# Patient Record
Sex: Female | Born: 1960 | Race: Black or African American | Hispanic: No | Marital: Married | Smoking: Never smoker
Health system: Southern US, Community
[De-identification: ages and names within clinical notes are randomized; demographics above are authoritative.]

## PROBLEM LIST (undated history)

## (undated) DIAGNOSIS — K219 Gastro-esophageal reflux disease without esophagitis: Secondary | ICD-10-CM

## (undated) DIAGNOSIS — Z87898 Personal history of other specified conditions: Secondary | ICD-10-CM

## (undated) DIAGNOSIS — E0590 Thyrotoxicosis, unspecified without thyrotoxic crisis or storm: Secondary | ICD-10-CM

## (undated) DIAGNOSIS — R42 Dizziness and giddiness: Secondary | ICD-10-CM

## (undated) DIAGNOSIS — B019 Varicella without complication: Secondary | ICD-10-CM

## (undated) DIAGNOSIS — T7840XA Allergy, unspecified, initial encounter: Secondary | ICD-10-CM

## (undated) HISTORY — DX: Varicella without complication: B01.9

## (undated) HISTORY — DX: Gastro-esophageal reflux disease without esophagitis: K21.9

## (undated) HISTORY — DX: Personal history of other specified conditions: Z87.898

## (undated) HISTORY — DX: Allergy, unspecified, initial encounter: T78.40XA

## (undated) HISTORY — DX: Dizziness and giddiness: R42

## (undated) HISTORY — PX: LIPOSUCTION MULTIPLE BODY PARTS: SUR832

---

## 1999-02-03 HISTORY — PX: COLPOSCOPY: SHX161

## 2007-04-03 LAB — CONVERTED CEMR LAB: Pap Smear: NORMAL

## 2007-05-24 ENCOUNTER — Encounter: Payer: Self-pay | Admitting: Internal Medicine

## 2008-12-04 ENCOUNTER — Ambulatory Visit: Payer: Self-pay | Admitting: Internal Medicine

## 2008-12-04 DIAGNOSIS — K219 Gastro-esophageal reflux disease without esophagitis: Secondary | ICD-10-CM | POA: Insufficient documentation

## 2008-12-04 DIAGNOSIS — J309 Allergic rhinitis, unspecified: Secondary | ICD-10-CM | POA: Insufficient documentation

## 2008-12-04 DIAGNOSIS — Z8669 Personal history of other diseases of the nervous system and sense organs: Secondary | ICD-10-CM

## 2009-04-12 ENCOUNTER — Ambulatory Visit: Payer: Self-pay | Admitting: Internal Medicine

## 2009-04-12 LAB — CONVERTED CEMR LAB
Bilirubin, Direct: 0.1 mg/dL (ref 0.0–0.3)
Cholesterol: 197 mg/dL (ref 0–200)
Eosinophils Absolute: 0.2 10*3/uL (ref 0.0–0.7)
GFR calc non Af Amer: 86.32 mL/min (ref >60)
Glucose, Bld: 95 mg/dL (ref 70–99)
HDL: 49.1 mg/dL (ref >39.00)
Hemoglobin, Urine: NEGATIVE
Lymphs Abs: 2.1 10*3/uL (ref 0.7–4.0)
MCHC: 31.7 g/dL (ref 30.0–36.0)
MCV: 82.5 fL (ref 78.0–100.0)
Monocytes Absolute: 0.4 10*3/uL (ref 0.1–1.0)
Neutrophils Relative %: 51.3 % (ref 43.0–77.0)
Nitrite: NEGATIVE
Platelets: 272 10*3/uL (ref 150.0–400.0)
Potassium: 4.5 meq/L (ref 3.5–5.1)
RDW: 14.5 % (ref 11.5–14.6)
Sodium: 140 meq/L (ref 135–145)
TSH: 0.8 microintl units/mL (ref 0.35–5.50)
Total Bilirubin: 0.6 mg/dL (ref 0.3–1.2)
Total Protein, Urine: NEGATIVE mg/dL
Triglycerides: 69 mg/dL (ref 0.0–149.0)
VLDL: 13.8 mg/dL (ref 0.0–40.0)
pH: 5.5 (ref 5.0–8.0)

## 2009-04-24 ENCOUNTER — Ambulatory Visit: Payer: Self-pay | Admitting: Internal Medicine

## 2009-04-24 ENCOUNTER — Other Ambulatory Visit: Admission: RE | Admit: 2009-04-24 | Discharge: 2009-04-24 | Payer: Self-pay | Admitting: Internal Medicine

## 2009-04-24 DIAGNOSIS — M25569 Pain in unspecified knee: Secondary | ICD-10-CM | POA: Insufficient documentation

## 2009-04-24 DIAGNOSIS — N959 Unspecified menopausal and perimenopausal disorder: Secondary | ICD-10-CM | POA: Insufficient documentation

## 2009-04-24 LAB — HM PAP SMEAR

## 2009-04-30 LAB — CONVERTED CEMR LAB: Pap Smear: NEGATIVE

## 2009-10-14 ENCOUNTER — Telehealth: Payer: Self-pay | Admitting: Internal Medicine

## 2009-10-17 ENCOUNTER — Ambulatory Visit: Payer: Self-pay | Admitting: Internal Medicine

## 2009-10-17 DIAGNOSIS — E669 Obesity, unspecified: Secondary | ICD-10-CM | POA: Insufficient documentation

## 2009-10-17 DIAGNOSIS — N946 Dysmenorrhea, unspecified: Secondary | ICD-10-CM

## 2009-10-17 DIAGNOSIS — IMO0002 Reserved for concepts with insufficient information to code with codable children: Secondary | ICD-10-CM

## 2009-11-13 ENCOUNTER — Telehealth: Payer: Self-pay | Admitting: *Deleted

## 2009-11-19 ENCOUNTER — Ambulatory Visit: Payer: Self-pay | Admitting: Internal Medicine

## 2009-11-19 LAB — CONVERTED CEMR LAB: Beta hcg, urine, semiquantitative: NEGATIVE

## 2009-11-22 ENCOUNTER — Telehealth: Payer: Self-pay | Admitting: *Deleted

## 2010-03-04 NOTE — Progress Notes (Signed)
Summary: need lab results   Phone Note Call from Patient Call back at Home Phone [phone]   Caller: Patient---triage vm Summary of Call: need labs results. please fax copy to her surgeon in [facility]. wants shannon to return her call. Initial call taken by: Warnell Forester,  November 22, 2009 9:58 AM  Follow-up for Phone Call        Spoke to pt- The fax number is [facility]  [phone] Follow-up by: Romualdo Bolk, CMA Duncan Dull),  November 22, 2009 11:52 AM  Additional Follow-up for Phone Call Additional follow up Details #1::        Results faxed to md. Additional Follow-up by: Romualdo Bolk, CMA Duncan Dull),  November 22, 2009 11:55 AM

## 2010-03-04 NOTE — Progress Notes (Signed)
Summary: need more info on upcoming appt.  Phone Note Outgoing Call Call back at [facility] Phone [phone]   Call placed by: Romualdo Bolk, CMA Duncan Dull),  October 14, 2009 5:27 PM Call placed to: Patient Summary of Call: Need more info on upcoming appt. Left message for pt to call back. Does she have a form that needs to be completed? Initial call taken by: Romualdo Bolk, CMA Duncan Dull),  October 14, 2009 5:28 PM  Follow-up for Phone Call        LMTOCB Follow-up by: Romualdo Bolk, CMA Duncan Dull),  October 16, 2009 8:59 AM  Additional Follow-up for Phone Call Additional follow up Details #1::        Spoke to pt and she just needs a EKG for lipo. No forms needed to fill out for surgery. Additional Follow-up by: Romualdo Bolk, CMA (AAMA),  October 17, 2009 8:03 AM

## 2010-03-04 NOTE — Progress Notes (Signed)
Summary: Pt req to get some lab work done for elective surgery  Phone Note Call from Patient Call back at [facility] Phone [phone]   Caller: Patient Summary of Call: Pt called and has been scheduled for elective Lipo surgery and is req to get some blood work, H&H and urine pregnacy. Pls advise.  Initial call taken by: Lucy Antigua,  November 13, 2009 9:49 AM  Follow-up for Phone Call        Pt has a rx order for labs and pt is to bring it with her. Follow-up by: Romualdo Bolk, CMA Duncan Dull),  November 13, 2009 11:56 AM  Additional Follow-up for Phone Call Additional follow up Details #1::        ok to do this  Additional Follow-up by: Madelin Headings MD,  November 13, 2009 5:50 PM    Additional Follow-up for Phone Call Additional follow up Details #2::    Left message for pt to call back to schedule the labs. Romualdo Bolk, CMA (AAMA)  November 15, 2009 11:42 AM appt made. Follow-up by: Romualdo Bolk, CMA (AAMA),  November 15, 2009 11:57 AM

## 2010-03-04 NOTE — Assessment & Plan Note (Signed)
Summary: EKG FOR MED CLEARANCE (FOR ELECTIVE SURGERY - LIPO) // RS   Vital Signs:  Patient profile:   50 year old female Menstrual status:  irregular LMP:     10/04/2009 Weight:      182 pounds Pulse rate:   72 / minute BP sitting:   120 / 80  (left arm) Cuff size:   regular  Vitals Entered By: Romualdo Bolk, CMA (AAMA) (October 17, 2009 8:33 AM) CC: Pt needs a EKG for lipo surgery on 10/24. LMP (date): 10/04/2009 LMP - Character: normal Menarche (age onset years): 14   Menses interval (days): 28-32 Menstrual flow (days): 4-5 Enter LMP: 10/04/2009 Last PAP Result NEGATIVE FOR INTRAEPITHELIAL LESIONS OR MALIGNANCY.   History of Present Illness: Deborah Mcdowell comes in today  for  above . 1.She is to have  liposuction for lateral back chest  area   on OCtober  24  Dr Edrick Oh.    and  poss ible buttock augmentation. .    She does reg exercise but no weights.    NO CP sob bleeding clotting in hx . and no fam hx of same  .     2.Has dysmenorrhea and usu takes nsaid but stopped pre op .   no asa.  asks for advice   3. 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   no numbness some pain on elevation and rotations .   pain  up  near trapezius.  no fall and no weakness  Preventive Screening-Counseling & Management  Alcohol-Tobacco     Alcohol drinks/day: 0     Smoking Status: never  Caffeine-Diet-Exercise     Caffeine use/day: less than 1     Does Patient Exercise: yes     Type of exercise: cardio  Current Medications (verified): 1)  Nexium 20 Mg Cpdr (Esomeprazole Magnesium) .Marland Kitchen.. 1 By Mouth Once Daily 2)  Fluticasone Propionate 50 Mcg/act Susp (Fluticasone Propionate) .... 2 Sprays Each Nares Q D  Allergies (verified): No Known Drug Allergies  Past History:  Past medical, surgical, family and social histories (including risk factors) reviewed for relevance to current acute and chronic problems.  Past Medical History: Reviewed history from  12/04/2008 and no changes required. Allergic rhinitis GERD Varicella  as a child  Vertigo intermittent positional   neg ct scan  G1P1 remote hx of atypical pap  ASCUS  neg HPV neg colp 2001  Normal otherwise   Past Surgical History: Reviewed history from 12/04/2008 and no changes required. Denies surgical history  Past History:  Care Management: None Current Surgery: Bonney Roussel, MD  Family History: Reviewed history from 12/04/2008 and no changes required. Father: Deceased-old age  98 Mother: HBP  84   Siblings: Sister-stroke, HBP  late 34  ,   Moyamoya ,  ; Sister passed of leukemia  23 Daughter : Crohns   Social History: Reviewed history from 12/04/2008 and no changes required. 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    Physical Exam  General:  Well-developed,well-nourished,in no acute distress; alert,appropriate and cooperative throughout examination Head:  normocephalic and  atraumatic.   Eyes:  vision grossly intact, pupils equal, and pupils round.   Ears:  R ear normal and L ear normal.   Neck:  No deformities, masses, or tenderness noted. Lungs:  Normal respiratory effort, chest expands symmetrically. Lungs are clear to auscultation, no crackles or wheezes. Heart:  Normal rate and regular rhythm. S1 and S2 normal without gallop, murmur, click, rub or other extra sounds.no lifts.   Abdomen:  Bowel sounds positive,abdomen soft and non-tender without masses, organomegaly or   noted. Msk:  right shoulder   nl rom  but tender  internal rotation.   No atrophy.   nl strength    Pulses:  pulses intact without delay  .   Extremities:  no clubbing cyanosis or edema . Neurologic:  alert & oriented X3 and gait normal.   nl strength  grossly non focal  Skin:  turgor normal, color normal, no ecchymoses, no petechiae, and no purpura.   Cervical Nodes:  No lymphadenopathy noted Psych:  Oriented X3, normally interactive, good eye contact, not anxious appearing, and not depressed appearing.   EKG NSR  Impression & Recommendations:  Problem # 1:  PREOPERATIVE EXAMINATION (ICD-V72.84)  for liposuction back  and poss buttock augmentation. No CI to surgery .  Orders: EKG w/ Interpretation (93000)  Problem # 2:  SHOULDER STRAIN, RIGHT (ICD-840.9) mild  shoulder strain  from lifting pat at work  . good rom today  and strength.   Ho with  exercises  given  and sic ice after work  if persistent or  progressive  call and consider see  ortho.   Problem # 3:  DYSMENORRHEA (ICD-625.3) disc  to take nsaid high dose   800 IBU or 2 aleve .. as long as none 2-3 days pre operatively .  Problem # 4:  GERD (ICD-530.81) only taking as needed  certain food s  aggravate.  Her updated medication list for this problem includes:    Nexium 20 Mg Cpdr (Esomeprazole magnesium) .Marland Kitchen... 1 by mouth once daily  Problem # 5:  OBESITY (ICD-278.00)  Ht: 62.5 (04/24/2009)   Wt: 182 (10/17/2009)   BMI: 31.97 (04/24/2009)  Complete Medication List: 1)  Nexium 20 Mg Cpdr (Esomeprazole magnesium) .Marland Kitchen.. 1 by mouth once daily 2)  Fluticasone Propionate 50 Mcg/act Susp (Fluticasone propionate) .... 2 sprays each nares q d  Patient Instructions: 1)  No contraindicatoins to surgery and EKG is normal 2)  Notify  us if more information is needed. 3)  shoulder   exercises  as discussed.

## 2010-03-04 NOTE — Assessment & Plan Note (Signed)
Summary: CPX//CCM/pt rescd//ccm   Vital Signs:  Patient profile:   50 year old female Menstrual status:  irregular LMP:     02/24/2009 Height:      62.5 inches Weight:      177 pounds BMI:     31.97 Pulse rate:   72 / minute BP sitting:   120 / 80  (right arm) Cuff size:   regular  Vitals Entered By: Romualdo Bolk, CMA Duncan Dull) (April 24, 2009 9:49 AM)  Nutrition Counseling: Patient's BMI is greater than 25 and therefore counseled on weight management options. CC: CPX with pap LMP (date): 02/24/2009 LMP - Character: normal Menarche (age onset years): 14   Menses interval (days): 28-32 Menstrual flow (days): 4-5 Menstrual Status irregular Enter LMP: 02/24/2009 Last PAP Result normal   History of Present Illness: Deborah Mcdowell comes in today for   for preventive visit   ? perimenopause hot  flushes off and on at night . Allergies ocass headaches  GERD  better on 20mg  and takes as needed.  BVaginal dryness beginning to use  otcs lubricants. interested in Liposuction  ? hard to lose weight  Preventive Care Screening  Prior Values:    Pap Smear:  normal (04/03/2007)    Mammogram:  normal (06/03/2007)    Last Tetanus Booster:  Historical (02/03/2007)   Preventive Screening-Counseling & Management  Alcohol-Tobacco     Alcohol drinks/day: 0     Smoking Status: never  Caffeine-Diet-Exercise     Caffeine use/day: less than 1     Does Patient Exercise: yes     Type of exercise: cardio  Hep-HIV-STD-Contraception     Dental Visit-last 6 months no     Sun Exposure-Excessive: no  Safety-Violence-Falls     Seat Belt Use: yes     Firearms in the Home: no firearms in the home     Smoke Detectors: yes      Blood Transfusions:  no.        Travel History:  no risk.    Current Medications (verified): 1)  Nexium 40 Mg Cpdr (Esomeprazole Magnesium) .Marland Kitchen.. 1 By Mouth Once Daily 2)  Fluticasone Propionate 50 Mcg/act Susp (Fluticasone Propionate) .... 2 Sprays Each Nares Q  D  Allergies (verified): No Known Drug Allergies  Past History:  Past medical, surgical, family and social histories (including risk factors) reviewed, and no changes noted (except as noted below).  Past Medical History: Reviewed history from 12/04/2008 and no changes required. Allergic rhinitis GERD Varicella  as a child  Vertigo intermittent positional   neg ct scan  G1P1 remote hx of atypical pap  ASCUS  neg HPV neg colp 2001  Normal otherwise   Past Surgical History: Reviewed history from 12/04/2008 and no changes required. Denies surgical history  Past History:  Care Management: None Current  Family History: Reviewed history from 12/04/2008 and no changes required. Father: Deceased-old age  74 Mother: HBP  48   Siblings: Sister-stroke, HBP  late 18  ,   Moyamoya ,  ; Sister passed of leukemia  24 Daughter  Crohns   Social History: Reviewed history from 12/04/2008 and no changes required. 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 Never Smoked Alcohol use-no Drug use-no Regular exercise-yes hhof 3  No pets  Mved back from [place] 2009 Seat Belt Use:  yes Dental Care w/in 6 mos.:  no Sun Exposure-Excessive:  no Blood Transfusions:  no  Review of Systems  The patient denies anorexia, fever, weight loss,  vision loss, decreased hearing, hoarseness, chest pain, syncope, dyspnea on exertion, peripheral edema, prolonged cough, hemoptysis, abdominal pain, melena, hematochezia, severe indigestion/heartburn, hematuria, muscle weakness, transient blindness, difficulty walking, depression, unusual weight change, abnormal bleeding, enlarged lymph nodes, angioedema, and breast masses.         right knee  pain bothers her when getting up  not at work no injury no am problem or limp.  Physical Exam General Appearance: well developed, well nourished, no acute distress Eyes: conjunctiva and lids normal, PERRLA, EOMI,  WNL Ears, Nose, Mouth, Throat: TM clear, nares  clear, oral exam WNL Neck: supple, no lymphadenopathy, no thyromegaly, no JVD Respiratory: clear to auscultation and percussion, respiratory effort normal Cardiovascular: regular rate and rhythm, S1-S2, no murmur, rub or gallop, no bruits, peripheral pulses normal and symmetric, no cyanosis, clubbing, edema or varicosities Chest: no scars, masses, tenderness; no asymmetry, skin changes, nipple discharge   Gastrointestinal: soft, non-tender; no hepatosplenomegaly, masses; active bowel sounds all quadrants, guaiac negative stool; no masses, tenderness, hemorrhoids  Genitourinary: no vaginal discharge, lesions; no masses or tenderness  PAP done  uterus  slighlty enlarged  vs bladder  Lymphatic: no cervical, axillary or inguinal adenopathy Musculoskeletal: gait normal, muscle tone and strength WNL, no joint swelling, effusions, discoloration, crepitus  Skin: clear, good turgor, color WNL, no rashes, lesions, or ulcerations Neurologic: normal mental status, normal reflexes, normal strength, sensation, and motion Psychiatric: alert; oriented to person, place and time Other Exam:  see labs normal     Impression & Recommendations:  Problem # 1:  HEALTH MAINTENANCE EXAM (ICD-V70.0) Discussed nutrition,exercise,diet,healthy weight, vitamin D and calcium.   Stool cards given  Problem # 2:  ROUTINE GYNECOLOGICAL EXAM (ICD-V72.31)  pap done   Orders: Pap Smear, Thin Prep ( Collection of) (Z6109)  Problem # 3:  PERIMENOPAUSAL SYNDROME (ICD-627.9) flushes and vag dryness  counseled   Problem # 4:  GERD (ICD-530.81)  Her updated medication list for this problem includes:    Nexium 20 Mg Cpdr (Esomeprazole magnesium) .Marland Kitchen... 1 by mouth once daily  Problem # 5:  aKNEE PAIN (ICD-719.46) no alarm symptom intermittent  disc  exercises and to see if persistent and progressive   further evaluation  Complete Medication List: 1)  Nexium 20 Mg Cpdr (Esomeprazole magnesium) .Marland Kitchen.. 1 by mouth once daily 2)   Fluticasone Propionate 50 Mcg/act Susp (Fluticasone propionate) .... 2 sprays each nares q d  Patient Instructions: 1)  will let your know about PAP 2)  It is important that you exercise reguarly at least 20 minutes 5 times a week. If you develop chest pain, have severe difficulty breathing, or feel very tired, stop exercising immediately and seek medical attention.  3)  Call if topical lubricants not helping. 4)  Your BMI is  31  . 5)   to lose weight. Consider a lower calorie diet and regular exercise.  6)  Get a mammogram  7)  CPX pap in a year  and check as needed.

## 2010-05-07 ENCOUNTER — Other Ambulatory Visit (INDEPENDENT_AMBULATORY_CARE_PROVIDER_SITE_OTHER): Admitting: Internal Medicine

## 2010-05-07 DIAGNOSIS — Z0000 Encounter for general adult medical examination without abnormal findings: Secondary | ICD-10-CM

## 2010-05-07 LAB — CBC WITH DIFFERENTIAL/PLATELET
Basophils Absolute: 0 10*3/uL (ref 0.0–0.1)
Eosinophils Absolute: 0.2 10*3/uL (ref 0.0–0.7)
HCT: 41.2 % (ref 36.0–46.0)
Hemoglobin: 13.3 g/dL (ref 12.0–15.0)
Lymphs Abs: 2.1 10*3/uL (ref 0.7–4.0)
MCHC: 32.3 g/dL (ref 30.0–36.0)
Monocytes Absolute: 0.5 10*3/uL (ref 0.1–1.0)
Monocytes Relative: 8.4 % (ref 3.0–12.0)
Neutro Abs: 3.5 10*3/uL (ref 1.4–7.7)
Platelets: 290 10*3/uL (ref 150.0–400.0)
RDW: 17.2 % — ABNORMAL HIGH (ref 11.5–14.6)

## 2010-05-07 LAB — LIPID PANEL
HDL: 42.7 mg/dL (ref >39.00)
Total CHOL/HDL Ratio: 5
Triglycerides: 46 mg/dL (ref 0.0–149.0)
VLDL: 9.2 mg/dL (ref 0.0–40.0)

## 2010-05-07 LAB — HEPATIC FUNCTION PANEL
ALT: 20 U/L (ref 0–35)
AST: 30 U/L (ref 0–37)
Alkaline Phosphatase: 62 U/L (ref 39–117)
Bilirubin, Direct: 0.1 mg/dL (ref 0.0–0.3)
Total Bilirubin: 0.7 mg/dL (ref 0.3–1.2)
Total Protein: 7.7 g/dL (ref 6.0–8.3)

## 2010-05-07 LAB — POCT URINALYSIS DIPSTICK
Bilirubin, UA: NEGATIVE
Blood, UA: NEGATIVE
Ketones, UA: NEGATIVE
Nitrite, UA: NEGATIVE
Protein, UA: NEGATIVE
pH, UA: 5.5

## 2010-05-07 LAB — BASIC METABOLIC PANEL
BUN: 14 mg/dL (ref 6–23)
CO2: 28 mEq/L (ref 19–32)
GFR: 88.18 mL/min (ref >60.00)
Glucose, Bld: 77 mg/dL (ref 70–99)
Potassium: 4.7 mEq/L (ref 3.5–5.1)
Sodium: 141 mEq/L (ref 135–145)

## 2010-05-08 ENCOUNTER — Encounter: Payer: Self-pay | Admitting: Internal Medicine

## 2010-05-14 ENCOUNTER — Encounter: Payer: Self-pay | Admitting: Internal Medicine

## 2010-05-14 ENCOUNTER — Ambulatory Visit (INDEPENDENT_AMBULATORY_CARE_PROVIDER_SITE_OTHER): Admitting: Internal Medicine

## 2010-05-14 VITALS — BP 128/80 | HR 72 | Ht 62.5 in | Wt 180.0 lb

## 2010-05-14 DIAGNOSIS — J309 Allergic rhinitis, unspecified: Secondary | ICD-10-CM

## 2010-05-14 DIAGNOSIS — K219 Gastro-esophageal reflux disease without esophagitis: Secondary | ICD-10-CM

## 2010-05-14 DIAGNOSIS — E785 Hyperlipidemia, unspecified: Secondary | ICD-10-CM

## 2010-05-14 DIAGNOSIS — M79671 Pain in right foot: Secondary | ICD-10-CM

## 2010-05-14 DIAGNOSIS — N959 Unspecified menopausal and perimenopausal disorder: Secondary | ICD-10-CM

## 2010-05-14 DIAGNOSIS — Z0000 Encounter for general adult medical examination without abnormal findings: Secondary | ICD-10-CM

## 2010-05-14 DIAGNOSIS — M79609 Pain in unspecified limb: Secondary | ICD-10-CM

## 2010-05-14 MED ORDER — FLUTICASONE PROPIONATE 50 MCG/ACT NA SUSP: 2.0000 | Freq: Every day | NASAL | Status: DC

## 2010-05-14 MED ORDER — ESOMEPRAZOLE MAGNESIUM 20 MG PO CPDR: 20.0000 mg | DELAYED_RELEASE_CAPSULE | Freq: Every day | ORAL | Status: DC

## 2010-05-14 NOTE — Progress Notes (Signed)
Subjective:    Patient ID: Deborah Mcdowell, female    DOB: March 09, 1960, 50 y.o.   MRN: [id number]  HPI Patient comes in for a check up today No major change in health status since last visit .  Had liposuction .  Has some swelling left abd nontender GERD:  Using nexium as needed  Stable better with dietary changes. Allergies  : on INCS  Needs refills  Right foot hurts for a day or so medial pain no injury but does 12 hours shifts on feet. No swelling. Good shoes   Review of Systems Neg cv pulm gi GU irreg menses last one march has some hot flushes.  Rest [place] or neg  Or as HPI Past Medical History  Diagnosis Date  . Allergy   . GERD (gastroesophageal reflux disease)   . Varicella     as a child  . Vertigo, intermittent     positional neg ct scan  . History of abnormal Pap smear     atypical ASCUS neg HPV neg    Past Surgical History  Procedure Date  . Colposcopy 2001  . Liposuction multiple body parts fall 2011    reports that she has never smoked. She does not have any smokeless tobacco history on file. She reports that she does not drink alcohol or use illicit drugs. family history includes Crohn's disease in her daughter; Hypertension in her mother and sister; Leukemia in her sister; Other in her sister; and Stroke in her sister. No Known Allergies     Objective:   Physical Exam Physical Exam: Vital signs reviewed YQM:VHQI is a well-developed well-nourished alert cooperative  AA  female who appears her stated age in no acute distress. Healthy appearing HEENT: normocephalic  traumatic , Eyes: PERRL EOM's full, conjunctiva clear, Nares: paten,t no deformity discharge or tenderness., Ears: no deformity EAC's clear TMs with normal landmarks. Mouth: clear OP, no lesions, edema.  Moist mucous membranes. Dentition in adequate repair. NECK: supple without masses, thyromegaly or bruits. CHEST/PULM:  Clear to auscultation and percussion breath sounds equal no wheeze , rales or rhonchi. No  chest wall deformities or tenderness. Breast: normal by inspection . No dimpling, discharge, masses, tenderness or discharge . CV: PMI is nondisplaced, S1 S2 no gallops, murmurs, rubs. Peripheral pulses are full without delay.No JVD .  ABDOMEN: Bowel sounds normal nontender  No guard or rebound, no hepato splenomegal no CVA tenderness.  No hernia.  Left upper lat abd shows some brawny induration but no hernia and non tender. Extremtities:  No clubbing cyanosis or edema, no acute joint swelling or redness no focal atrophy  Right foot tender behind medal malleolus  ? Retro calc area  NO achilles pain or tenderness and nl ROM no warmth or redness. NEURO:  Oriented x3, cranial nerves 3-12 appear to be intact, no obvious focal weakness,gait within normal limits no abnormal reflexes or asymmetrical SKIN: No acute rashes normal turgor, color, no bruising or petechiae. PSYCH: Oriented, good eye contact, no obvious depression anxiety, cognition and judgment appear normal.  Labs reviewed  Slight inc rdw no anemia and lipid ratio now a 5        Assessment & Plan:  Preventive Health Care UTD  Pap nl last visit and had neg hpv in past when had ascus  Ok to go to every 2 -3 years  For monitoring. Allergic rhinitis  Stable  On INCS GERD:  Only taking med intermittently   Ok to do lsi and continue for  now . Right foot pain  Seems like strain medial or bursitis retrocalc   No alarm features  Can use support   In shoe ice at night and call if  persistent or progressive  LIPIDS   rec LSI   Disc strategies.  Abdominal wall asymmetry  prob from the liposuction  She should discuss with surgeon.

## 2010-05-14 NOTE — Patient Instructions (Signed)
Intensify  lifestyle intervention healthy eating and exercise .  To help   Lipid   Control. Ice a tend of day ankle support ok  If  persistent or progressive over a week or so call for advice .

## 2010-05-15 ENCOUNTER — Encounter: Payer: Self-pay | Admitting: Internal Medicine

## 2010-05-15 DIAGNOSIS — M79671 Pain in right foot: Secondary | ICD-10-CM | POA: Insufficient documentation

## 2010-05-15 DIAGNOSIS — E785 Hyperlipidemia, unspecified: Secondary | ICD-10-CM | POA: Insufficient documentation

## 2010-08-14 ENCOUNTER — Ambulatory Visit (INDEPENDENT_AMBULATORY_CARE_PROVIDER_SITE_OTHER): Admitting: Internal Medicine

## 2010-08-14 ENCOUNTER — Encounter: Payer: Self-pay | Admitting: Internal Medicine

## 2010-08-14 VITALS — BP 120/80 | HR 66 | Wt 179.0 lb

## 2010-08-14 DIAGNOSIS — M25473 Effusion, unspecified ankle: Secondary | ICD-10-CM

## 2010-08-14 DIAGNOSIS — M25579 Pain in unspecified ankle and joints of unspecified foot: Secondary | ICD-10-CM

## 2010-08-14 DIAGNOSIS — M25472 Effusion, left ankle: Secondary | ICD-10-CM

## 2010-08-14 DIAGNOSIS — M25572 Pain in left ankle and joints of left foot: Secondary | ICD-10-CM

## 2010-08-14 NOTE — Progress Notes (Signed)
  Subjective:    Patient ID: Chilton Greathouse, female    DOB: 12-31-1960, 50 y.o.   MRN: [id number]  HPI Onset   During trip to [place] and had swelling left ankle and then pain in ankle and recurred after another trip to  [place].  No no injury calf pain or redness or streaking or systemic sx with this. Not that difficult at work.  No med just elevation. No past hx of same . Hurts some on lateral ankle area.    Review of Systems See hpi no cough cp sob   No knee arthritis but arthritis in family.no hx of clotting. Did have some ? Numbness left foot after her surgery ( lipo) but went away from anesthesia  Months ago) Past history family history social history reviewed in the electronic medical record.     Objective:   Physical Exam wdwn in nad   Nl gait. LE nl pulses and perfusion. Left ankle with mild swelling and tenderness lateral malleolus ant tib fib area some edema on top of foot.   Tender with passive eversion but nl ap motion.  Calf without tenderness swelling or redness.      Assessment & Plan:  Left ankle foot strain.Marland Kitchen/ vs other  Mild arthritis possible  No evidence of dvt  Characterization although occurred after car trip.   X ray  Ice elevation compression when up and around and nsaid . Fu or ortho if  persistent or progressive

## 2010-08-14 NOTE — Patient Instructions (Signed)
This acts like a lateral ankle strain but you have no hx of this. Good supporting shoes consider  ankle support . Ice at end of day and  nsaid as needed. Get x ray  Will notify you of results. If  persistent or progressive over another 3 weeks or so call and consider get ortho to see you.

## 2010-08-20 ENCOUNTER — Ambulatory Visit (INDEPENDENT_AMBULATORY_CARE_PROVIDER_SITE_OTHER)
Admission: RE | Admit: 2010-08-20 | Discharge: 2010-08-20 | Disposition: A | Discharge status: Home / Self Care | Source: Ambulatory Visit | Attending: Internal Medicine | Admitting: Internal Medicine

## 2010-08-20 DIAGNOSIS — M25572 Pain in left ankle and joints of left foot: Secondary | ICD-10-CM

## 2010-08-20 DIAGNOSIS — M25472 Effusion, left ankle: Secondary | ICD-10-CM

## 2010-08-20 DIAGNOSIS — M25579 Pain in unspecified ankle and joints of unspecified foot: Secondary | ICD-10-CM

## 2010-08-20 DIAGNOSIS — M25473 Effusion, unspecified ankle: Secondary | ICD-10-CM

## 2010-08-21 NOTE — Progress Notes (Signed)
Pt aware of results 

## 2011-02-04 ENCOUNTER — Encounter: Payer: Self-pay | Admitting: Internal Medicine

## 2011-02-04 ENCOUNTER — Ambulatory Visit (INDEPENDENT_AMBULATORY_CARE_PROVIDER_SITE_OTHER): Admitting: Internal Medicine

## 2011-02-04 ENCOUNTER — Telehealth: Payer: Self-pay | Admitting: Internal Medicine

## 2011-02-04 VITALS — BP 120/80 | HR 88 | Temp 98.1°F | Wt 191.0 lb

## 2011-02-04 DIAGNOSIS — J309 Allergic rhinitis, unspecified: Secondary | ICD-10-CM

## 2011-02-04 DIAGNOSIS — J988 Other specified respiratory disorders: Secondary | ICD-10-CM

## 2011-02-04 DIAGNOSIS — J45909 Unspecified asthma, uncomplicated: Secondary | ICD-10-CM

## 2011-02-04 DIAGNOSIS — J329 Chronic sinusitis, unspecified: Secondary | ICD-10-CM

## 2011-02-04 MED ORDER — PREDNISONE 20 MG PO TABS: ORAL_TABLET | ORAL | Status: AC

## 2011-02-04 MED ORDER — AMOXICILLIN-POT CLAVULANATE 875-125 MG PO TABS: 1.0000 | ORAL_TABLET | Freq: Two times a day (BID) | ORAL | Status: AC

## 2011-02-04 NOTE — Patient Instructions (Signed)
This acts like an asthmatic bronchitis   Poss triggered by a respiratory infection.  Will treat for this and also poss sinusitis that could be  Adding to the cough .  Unsure if you could be allergic to the cats .  Expect improvement in 3-5 days  Contact us  if not getting better

## 2011-02-04 NOTE — Progress Notes (Signed)
  Subjective:    Patient ID: Deborah Mcdowell, female    DOB: [birth year], 51 y.o.   MRN: [id number]  HPI  Patient comes in today for SDA  For acute problem evaluation. Onset with cough  For last month.  And some uri  Sx .  Never had fever.  Wheezing  At night.   For a few week. Asthma but does have allergies   Cough some phlegm clear.  Has used many over-the-counter medicines including mucinex delsym and cough drops. Has upper respiratory congestion to blowing nose some left nares irritated [place] for a month   [place]opt[place] 2 k[place]ens in [place] asks if could be allergic  Not allto cats in the past.   Review of Systems No cp sob  Hemoptysis  NVD new rash now is irritated  On left from blowing.    Working 2 days per week [place] got flu shot a week ago.  Past history family history social history reviewed in the electronic medical record.     Objective:   Physical Exam WDWN in NAD  quiet respirations; mildly congested  somewhat hoarse. Non toxic . HEENT: Normocephalic ;atraumatic , Eyes;  PERRL, EOMs  Full, lids and conjunctiva clear,,Ears: no deformities, canals nl, TM landmarks normal, Nose: no deformity or discharge but congested;face non  tender Mouth : OP clear without lesion or edema . Neck: Supple without adenopathy or masses or bruits Chest:  Clear to A&P without wheezes rales or rhonchi CV:  S1-S2 no gallops or murmurs peripheral perfusion is normal Skin :nl perfusion and no acute rashes   Deep bronchial cough noted when cough mostly dry.      Assessment & Plan:  coug hprolonged poss asthmatic bronchitis WARI  Empiric rx for sinsusitis also  Because of drainage issues    Expectant management.

## 2011-02-04 NOTE — Telephone Encounter (Signed)
Pt called and said that her meds today were suppose to be sent to [facility] on [address]. Pls resend script for prednisone and amoxicillin (augmentin) to correct pharmacy.

## 2011-02-04 NOTE — Telephone Encounter (Signed)
Advised pt to have the pharmacy call the other pharmacy to transfer the rx. Pt okay to do this.

## 2011-02-18 ENCOUNTER — Encounter: Payer: Self-pay | Admitting: Family

## 2011-02-18 ENCOUNTER — Ambulatory Visit (INDEPENDENT_AMBULATORY_CARE_PROVIDER_SITE_OTHER): Admitting: Family

## 2011-02-18 DIAGNOSIS — R062 Wheezing: Secondary | ICD-10-CM

## 2011-02-18 DIAGNOSIS — J309 Allergic rhinitis, unspecified: Secondary | ICD-10-CM

## 2011-02-18 MED ORDER — FEXOFENADINE-PSEUDOEPHED ER 180-240 MG PO TB24: 1.0000 | ORAL_TABLET | Freq: Every day | ORAL | Status: AC

## 2011-02-18 NOTE — Patient Instructions (Signed)
Allergic Rhinitis Allergic rhinitis is when the mucous membranes in the nose respond to allergens. Allergens are particles in the air that cause your body to have an allergic reaction. This causes you to release allergic antibodies. Through a chain of events, these eventually cause you to release histamine into the blood stream (hence the use of antihistamines). Although meant to be protective to the body, it is this release that causes your discomfort, such as frequent sneezing, congestion and an itchy runny nose.  CAUSES  The pollen allergens may come from grasses, trees, and weeds. This is seasonal allergic rhinitis, or "hay fever." Other allergens cause year-round allergic rhinitis (perennial allergic rhinitis) such as house dust mite allergen, pet dander and mold spores.  SYMPTOMS   Nasal stuffiness (congestion).   Runny, itchy nose with sneezing and tearing of the eyes.   There is often an itching of the mouth, eyes and ears.  It cannot be cured, but it can be controlled with medications. DIAGNOSIS  If you are unable to determine the offending allergen, skin or blood testing may find it. TREATMENT   Avoid the allergen.   Medications and allergy shots (immunotherapy) can help.   Hay fever may often be treated with antihistamines in pill or nasal spray forms. Antihistamines block the effects of histamine. There are over-the-counter medicines that may help with nasal congestion and swelling around the eyes. Check with your caregiver before taking or giving this medicine.  If the treatment above does not work, there are many new medications your caregiver can prescribe. Stronger medications may be used if initial measures are ineffective. Desensitizing injections can be used if medications and avoidance fails. Desensitization is when a patient is given ongoing shots until the body becomes less sensitive to the allergen. Make sure you follow up with your caregiver if problems continue. SEEK  MEDICAL CARE IF:   You develop fever (more than 100.5 F (38.1 C).   You develop a cough that does not stop easily (persistent).   You have shortness of breath.   You start wheezing.   Symptoms interfere with normal daily activities.  Document Released: 10/14/2000 Document Revised: 10/01/2010 Document Reviewed: 04/25/2008 Exit[employer] Patient Information 2012 [employer]. 

## 2011-02-18 NOTE — Progress Notes (Signed)
Subjective:    Patient ID: Deborah Mcdowell, female    DOB: [birth year], 51 y.o.   MRN: [id number]  HPI 51 year old [place] American female, patient of Dr. Fabian Mcdowell, nonsmoker who presents today with persistent sneezing, cough, congestion. She saw Dr. Fabian Mcdowell the end of December who started her on Augmentin and prednisone her symptoms. Patient reports as soon as she discontinued her prednisone, her symptoms returned. Patient reports that in November she adopted 2 kittens, and since that time she's had upper respiratory symptoms including wheezing. She has not taken any medications over-the-counter. She denies any lightheadedness, dizziness, shortness of breath, chest pain, palpitations or edema.   Review of Systems  Constitutional: Positive for fatigue.  HENT: Positive for congestion, rhinorrhea, sneezing and sinus pressure.   Eyes: Positive for itching.  Respiratory: Positive for cough and wheezing.   Cardiovascular: Negative.   Genitourinary: Negative.   Musculoskeletal: Negative.   Skin: Negative.   Neurological: Negative.   Hematological: Negative.    Past Medical History  Diagnosis Date  . Allergy   . GERD (gastroesophageal reflux disease)   . Varicella     as a child  . Vertigo, intermittent     positional neg ct scan  . History of abnormal Pap smear     atypical ASCUS neg HPV neg     History   Social History  . Marital Status: Married    Spouse Name: N/A    Number of Children: N/A  . Years of Education: N/A   Occupational History  . Not on file.   Social History Main Topics  . Smoking status: Never Smoker   . Smokeless tobacco: Not on file  . Alcohol Use: No  . Drug Use: No  . Sexually Active:    Other Topics Concern  . 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 Colposcopy 2001  . Liposuction multiple  body parts fall 2011    Family History  Problem Relation Age of Onset  . Hypertension Mother   . Stroke Sister   . Hypertension Sister   . Other Sister     moyamoya  . Crohn's disease Daughter   . Leukemia Sister     No Known Allergies  Current Outpatient Prescriptions on File Prior to Visit  Medication Sig Dispense Refill  . esomeprazole (NEXIUM) 20 MG capsule Take 1 capsule (20 mg total) by mouth daily before breakfast.  30 capsule  11    BP 124/84  Temp(Src) 97.6 F (36.4 C) (Oral)  Wt 189 lb (85.73 kg)chart    Objective:   Physical Exam  Constitutional: She is oriented to person, place, and time. She appears well-developed.  HENT:  Right Ear: External ear normal.  Left Ear: External ear normal.  Nose: Nose normal.  Mouth/Throat: Oropharynx is clear and moist.  Eyes: Conjunctivae are normal. Pupils are equal, round, and reactive to light.  Neck: Normal range of motion. Neck supple.  Cardiovascular: Normal rate, regular rhythm and normal heart sounds.   Pulmonary/Chest: Effort normal and breath sounds normal.  Musculoskeletal: Normal range of motion.  Neurological: She is alert and oriented to person, place, and time.  Skin: Skin is warm and dry.  Psychiatric: She has a normal mood and affect.          Assessment & Plan:  Assessment: Allergic rhinitis,  likely related to a pet allergy  Plan: Allegra-D 24 hours 1 tablet daily. Advised patient that her likely trigger is from her kittens. Patient to followup with PCP as scheduled. Rest. Drink plenty of fluids. Call the office if symptoms worsen or persist, recheck as scheduled, and when necessary.

## 2011-05-07 ENCOUNTER — Other Ambulatory Visit (INDEPENDENT_AMBULATORY_CARE_PROVIDER_SITE_OTHER)

## 2011-05-07 DIAGNOSIS — Z0000 Encounter for general adult medical examination without abnormal findings: Secondary | ICD-10-CM

## 2011-05-07 LAB — LIPID PANEL
LDL Cholesterol: 106 mg/dL — ABNORMAL HIGH (ref 0–99)
VLDL: 15.2 mg/dL (ref 0.0–40.0)

## 2011-05-07 LAB — BASIC METABOLIC PANEL
BUN: 16 mg/dL (ref 6–23)
CO2: 28 mEq/L (ref 19–32)
Calcium: 9.7 mg/dL (ref 8.4–10.5)
Creatinine, Ser: 0.8 mg/dL (ref 0.4–1.2)
GFR: 92.66 mL/min (ref >60.00)
Glucose, Bld: 81 mg/dL (ref 70–99)

## 2011-05-07 LAB — CBC WITH DIFFERENTIAL/PLATELET
Basophils Absolute: 0.1 10*3/uL (ref 0.0–0.1)
Eosinophils Absolute: 0.7 10*3/uL (ref 0.0–0.7)
Lymphocytes Relative: 20.9 % (ref 12.0–46.0)
MCHC: 32.1 g/dL (ref 30.0–36.0)
Monocytes Relative: 10.3 % (ref 3.0–12.0)
Platelets: 335 10*3/uL (ref 150.0–400.0)
RDW: 15.5 % — ABNORMAL HIGH (ref 11.5–14.6)

## 2011-05-07 LAB — POCT URINALYSIS DIPSTICK
Ketones, UA: NEGATIVE
Leukocytes, UA: NEGATIVE
Protein, UA: NEGATIVE
pH, UA: 5.5

## 2011-05-07 LAB — HEPATIC FUNCTION PANEL
AST: 35 U/L (ref 0–37)
Albumin: 3.7 g/dL (ref 3.5–5.2)
Alkaline Phosphatase: 74 U/L (ref 39–117)
Total Bilirubin: 0.5 mg/dL (ref 0.3–1.2)

## 2011-05-08 LAB — T4, FREE: Free T4: 1.6 ng/dL (ref 0.60–1.60)

## 2011-05-14 ENCOUNTER — Ambulatory Visit (INDEPENDENT_AMBULATORY_CARE_PROVIDER_SITE_OTHER): Admitting: Internal Medicine

## 2011-05-14 ENCOUNTER — Other Ambulatory Visit (HOSPITAL_COMMUNITY)
Admission: RE | Admit: 2011-05-14 | Discharge: 2011-05-14 | Disposition: A | Discharge status: Home / Self Care | Source: Ambulatory Visit | Attending: Internal Medicine | Admitting: Internal Medicine

## 2011-05-14 ENCOUNTER — Encounter: Payer: Self-pay | Admitting: Internal Medicine

## 2011-05-14 VITALS — BP 112/70 | HR 92 | Temp 98.1°F | Wt 181.0 lb

## 2011-05-14 DIAGNOSIS — E785 Hyperlipidemia, unspecified: Secondary | ICD-10-CM

## 2011-05-14 DIAGNOSIS — R7989 Other specified abnormal findings of blood chemistry: Secondary | ICD-10-CM

## 2011-05-14 DIAGNOSIS — Z0000 Encounter for general adult medical examination without abnormal findings: Secondary | ICD-10-CM

## 2011-05-14 DIAGNOSIS — Z01419 Encounter for gynecological examination (general) (routine) without abnormal findings: Secondary | ICD-10-CM

## 2011-05-14 DIAGNOSIS — Z87898 Personal history of other specified conditions: Secondary | ICD-10-CM

## 2011-05-14 DIAGNOSIS — J309 Allergic rhinitis, unspecified: Secondary | ICD-10-CM

## 2011-05-14 DIAGNOSIS — N951 Menopausal and female climacteric states: Secondary | ICD-10-CM

## 2011-05-14 DIAGNOSIS — R6889 Other general symptoms and signs: Secondary | ICD-10-CM

## 2011-05-14 DIAGNOSIS — Z8742 Personal history of other diseases of the female genital tract: Secondary | ICD-10-CM

## 2011-05-14 MED ORDER — CICLESONIDE 50 MCG/ACT NA SUSP: 2.0000 | Freq: Every day | NASAL | Status: DC

## 2011-05-14 NOTE — Progress Notes (Signed)
Subjective:    Patient ID: Deborah Mcdowell, female    DOB: 08/25/1960, 51 y.o.   MRN: [id number]  HPI Patient comes in today for Preventive Health Care visit  Since last visit has na "sinus infection' better  But tends to  Have nose congestion . ? If allergic  Has kittens at home but doesnt think allergic .  NO cp sob asthma now. Some watery eyes and nose congestion.   Periods Skipped for 6 months ant then had on in January  Otherwise normal .some hot flushes   Has fam hx of graves . No meds or thyroid  Daughter had 1 131 rx / in the fall.  Works Engineer, manufacturing at [employer]   Review of Systems ROS:  GEN/ HEENT: No fever, significant weight changes headaches vision problems hearing changes, CV/ PULM; No chest pain shortness of breath cough, syncope,edema  change in exercise tolerance. GI /GU: No adominal pain, vomiting, change in bowel habits. No blood in the stool. No significant GU symptoms. SKIN/HEME: ,no acute skin rashes suspicious lesions or bleeding. No lymphadenopathy, nodules, masses.  NEURO/ PSYCH:  No neurologic signs such as weakness numbness. No depression anxiety. Some jitteriness at times  IMM/ Allergy: No unusual infections.  Allergy .   REST of 12 system review negative except as per HPI   Past history family history social history reviewed in the electronic medical record.      Objective:   Physical Exam BP 112/70  Pulse 92  Temp(Src) 98.1 F (36.7 C) (Oral)  Wt 181 lb (82.101 kg)  SpO2 98% Physical Exam: Vital signs reviewed WUJ:WJXB is a well-developed well-nourished alert cooperative  aa female who appears her stated age in no acute distress.  HEENT: normocephalic atraumatic , Eyes: PERRL EOM's full, conjunctiva clear, Nares: paten,t no deformity  But congested  no tenderness., Ears: no deformity EAC's clear TMs with normal landmarks. Mouth: clear OP, no lesions, edema.  Moist mucous membranes. Dentition in adequate repair. NECK: supple without masses, or bruits.   Palpable thyroid  CHEST/PULM:  Clear to auscultation and percussion breath sounds equal no wheeze , rales or rhonchi. No chest wall deformities or tenderness. CV: PMI is nondisplaced, S1 S2 no gallops, murmurs, rubs. Peripheral pulses are full without delay.No JVD .  Breast: normal by inspection . No dimpling, discharge, masses, tenderness or discharge . ABDOMEN: Bowel sounds normal nontender  No guard or rebound, no hepato splenomegal no CVA tenderness.  No hernia.  Extremtities:  No clubbing cyanosis or edema, no acute joint swelling or redness no focal atrophy NEURO:  Oriented x3, cranial nerves 3-12 appear to be intact, no obvious focal weakness,gait within normal limits no abnormal reflexes or asymmetrical SKIN: No acute rashes normal turgor, color, no bruising or petechiae. PSYCH: Oriented, good eye contact, no obvious depression anxiety, cognition and judgment appear normal. LN: no cervical axillary inguinal adenopathy Pelvic: NL ext GU, labia clear without lesions or rash . Vagina no lesions .Cervix: clear  UTERUS: Neg CMT Adnexa:  clear no masses . PAP done  Rectal neg mass smear hem neg  irreg abd interfering with assessing uterine size  But no concerns      Lab Results  Component Value Date   WBC 9.5 05/07/2011   HGB 12.7 05/07/2011   HCT 39.7 05/07/2011   PLT 335.0 05/07/2011   GLUCOSE 81 05/07/2011   CHOL 153 05/07/2011   TRIG 76.0 05/07/2011   HDL 32.00* 05/07/2011   LDLCALC 106* 05/07/2011   ALT  31 05/07/2011   AST 35 05/07/2011   NA 141 05/07/2011   K 4.3 05/07/2011   CL 104 05/07/2011   CREATININE 0.8 05/07/2011   BUN 16 05/07/2011   CO2 28 05/07/2011   TSH 0.15* 05/07/2011       Assessment & Plan:   Preventive Health Care Counseled regarding healthy nutrition, exercise, sleep, injury prevention, calcium vit d and healthy weight . Pap done today  den have mammo recently get one  EHR is incorrect   Call when want referral for screening colonoscopy Lipids  Intensify lifestyle  interventions. Recurrent Rhinitis   Poss allergic  Abnormal tsh     Free T4 ULN  fam hx of graves   Plan consult  Daughter sees Dr Haynes Kerns will get opinion about  Follow up evaluation   Perimenopausal

## 2011-05-14 NOTE — Patient Instructions (Addendum)
Will notify you  of pap when available.  You my have underlying  Allergy that sets you up for sinus i nfection. There are other meds beside flonase and ou may do better with these.  We will plan  Endocrine consult about the abnormal TSH level . Possible subclinical hyperthyroid.   Intensify lifestyle interventions. To help your cholesterol levels.    Get  Your mammogram .

## 2011-05-19 NOTE — Progress Notes (Signed)
Quick Note:  Tell patient PAP is normal. ______ 

## 2011-05-29 ENCOUNTER — Encounter: Payer: Self-pay | Admitting: Internal Medicine

## 2011-05-29 ENCOUNTER — Ambulatory Visit (INDEPENDENT_AMBULATORY_CARE_PROVIDER_SITE_OTHER): Admitting: Internal Medicine

## 2011-05-29 VITALS — BP 120/76 | HR 83 | Temp 98.6°F | Wt 184.0 lb

## 2011-05-29 DIAGNOSIS — M5416 Radiculopathy, lumbar region: Secondary | ICD-10-CM

## 2011-05-29 DIAGNOSIS — IMO0002 Reserved for concepts with insufficient information to code with codable children: Secondary | ICD-10-CM

## 2011-05-29 MED ORDER — CYCLOBENZAPRINE HCL 10 MG PO TABS: 10.0000 mg | ORAL_TABLET | Freq: Three times a day (TID) | ORAL | Status: AC | PRN

## 2011-05-29 NOTE — Progress Notes (Signed)
  Subjective:    Patient ID: Deborah Mcdowell, female    DOB: Sep 27, 1960, 51 y.o.   MRN: [id number]  HPI  Patient comes in today for SDA for  new problem evaluation. Onset yesterday about 7 PM of acute problem Got up from toilet and turned to the right and felt something rub and pain  Down the left lower back with radiating pain down the left side of her leg and ankle. She wasn't able to move right away but had no weakness. She is taking ibuprofen 800 mg twice since then and used cold packs. She was able to sleep last night.  Now has some shoulder pain.    Last night couldn't bend over at all but now can do this walks with a limp denies any weakness. Pain is about a 6/10 No hx of injury to back and no hx of same.  Hx of sciatic pain pain in buttocks not down the leg. It only lasts for a day or 2. This is different.  Review of Systems Negative for chest pain shortness of breath falling fever urinary tract infection symptoms weakness.  Past history family history social history reviewed in the electronic medical record.     Objective:   Physical Exam BP 120/76  Pulse 83  Temp(Src) 98.6 F (37 C) (Oral)  Wt 184 lb (83.462 kg)  SpO2 95% Well-developed well-nourished in no acute distress with some moderate pain when she moves Gait is antalgic favors the left leg limping. Chest:  Clear to A&P without wheezes rales or rhonchi CV:  S1-S2 no gallops or murmurs peripheral perfusion is normal Back some tenderness the left lower lumbar area. DTRs are present bilaterally. She is able to elevate on toe and heel so no obvious weakness. Negative SLR.  Usual skin changes.    Assessment & Plan:  Acute left low back pain with radicular symptoms. No arm features such as weakness or fever.  Discussion about expectant management time to use anti-inflammatory treatment for now nice and followup in a couple weeks. Note written for work to be out at least 3 days may need longer. 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  If not improving an appropriate time frame we'll do more evaluation and intervention et Karie Soda.

## 2011-05-29 NOTE — Patient Instructions (Addendum)
You have a pinched nerve in her back that is causing her leg pain. This will take a while to improve. Agree with the treatment of ice and anti-inflammatory ibuprofen 800 mg about every 8 hours. He can use a muscle relaxant as needed but it could make you drowsy.  Avoid significant bending and lifting and prolonged sitting. Most back pain such as this is significantly better within 2 weeks and on in a month.  However if this is not improving significantly in this timeframe or you get weakness worsening then you need to contact us for further evaluation.  No work for the next 3 days.  And then as tolerated.  Repeat appointment in 2 weeks or as needed  Radicular Pain Radicular pain in either the arm or leg is usually from a bulging or herniated disk in the spine. A piece of the herniated disk may press against the nerves as the nerves exit the spine. This causes pain which is felt at the tips of the nerves down the arm or leg. Other causes of radicular pain may include:  Fractures.   Heart disease.   Cancer.   An abnormal and usually degenerative state of the nervous system or nerves (neuropathy).  Diagnosis may require CT or MRI scanning to determine the primary cause.  Nerves that start at the neck (nerve roots) may cause radicular pain in the outer shoulder and arm. It can spread down to the thumb and fingers. The symptoms vary depending on which nerve root has been affected. In most cases radicular pain improves with conservative treatment. Neck problems may require physical therapy, a neck collar, or cervical traction. Treatment may take many weeks, and surgery may be considered if the symptoms do not improve.  Conservative treatment is also recommended for sciatica. Sciatica causes pain to radiate from the lower back or buttock area down the leg into the foot. Often there is a history of back problems. Most patients with sciatica are better after 2 to 4 weeks of rest and other supportive  care. Short term bed rest can reduce the disk pressure considerably. Sitting, however, is not a good position since this increases the pressure on the disk. You should avoid bending, lifting, and all other activities which make the problem worse. Traction can be used in severe cases. Surgery is usually reserved for patients who do not improve within the first months of treatment. Only take over-the-counter or prescription medicines for pain, discomfort, or fever as directed by your caregiver. Narcotics and muscle relaxants may help by relieving more severe pain and spasm and by providing mild sedation. Cold or massage can give significant relief. Spinal manipulation is not recommended. It can increase the degree of disc protrusion. Epidural steroid injections are often effective treatment for radicular pain. These injections deliver medicine to the spinal nerve in the space between the protective covering of the spinal cord and back bones (vertebrae). Your caregiver can give you more information about steroid injections. These injections are most effective when given within two weeks of the onset of pain.  You should see your caregiver for follow up care as recommended. A program for neck and back injury rehabilitation with stretching and strengthening exercises is an important part of management.  SEEK IMMEDIATE MEDICAL CARE IF:  You develop increased pain, weakness, or numbness in your arm or leg.   You develop difficulty with bladder or bowel control.   You develop abdominal pain.  Document Released: 02/27/2004 Document Revised: 01/08/2011 Document Reviewed:  05/14/2008 ExitCare Patient Information 2012 [place], [place].

## 2011-06-30 ENCOUNTER — Other Ambulatory Visit (INDEPENDENT_AMBULATORY_CARE_PROVIDER_SITE_OTHER)

## 2011-06-30 ENCOUNTER — Encounter: Payer: Self-pay | Admitting: Endocrinology

## 2011-06-30 ENCOUNTER — Ambulatory Visit (INDEPENDENT_AMBULATORY_CARE_PROVIDER_SITE_OTHER): Admitting: Endocrinology

## 2011-06-30 VITALS — BP 124/82 | HR 87 | Temp 97.2°F | Ht 63.0 in | Wt 184.0 lb

## 2011-06-30 DIAGNOSIS — R6889 Other general symptoms and signs: Secondary | ICD-10-CM

## 2011-06-30 DIAGNOSIS — R7989 Other specified abnormal findings of blood chemistry: Secondary | ICD-10-CM

## 2011-06-30 LAB — TSH: TSH: 0.04 u[IU]/mL — ABNORMAL LOW (ref 0.35–5.50)

## 2011-06-30 NOTE — Patient Instructions (Signed)
blood tests are being requested for you today.  You will receive a letter with results. If the thyroid is overactive again, we would check a thyroid "scan" (a special, but easy and painless type of thyroid x ray).  It works like this: you go to the x-ray department of the hospital to swallow a pill, which contains a miniscule amount of radiation.  You will not notice any symptoms from this.  You will go back to the x-ray department the next day, to lie down in front of a camera.  The results of this will be sent to me.   Based on the results, i hope to order for you a treatment pill of radioactive iodine.  Although it is a larger amount of radiation, you will again notice no symptoms from this.  The pill is gone from your body in a few days (during which you should stay away from other people), but takes several months to work.  Therefore, please return here approximately 6-8 weeks after the treatment.  This treatment has been available for many years, and the only known side-effect is an underactive thyroid.  It is possible that i would eventually prescribe for you a thyroid hormone pill, which is very inexpensive.  You don't have to worry about side-effects of this thyroid hormone pill, because it is the same molecule your thyroid makes.

## 2011-06-30 NOTE — Progress Notes (Signed)
Subjective:    Patient ID: Deborah Mcdowell, female    DOB: 20-Jul-1960, 51 y.o.   MRN: [id number]  HPI Pt was noted on routine labs to have abnormal TSH.  She says in retrospect, she has a few mos of mild tremor of the hands, but no assoc anxiety. Past Medical History  Diagnosis Date  . Allergy   . GERD (gastroesophageal reflux disease)   . Varicella     as a child  . Vertigo, intermittent     positional neg ct scan  . History of abnormal Pap smear     atypical ASCUS neg HPV neg     Past Surgical History  Procedure Date  . Colposcopy 2001  . Liposuction multiple body parts fall 2011    History   Social History  . Marital Status: Married    Spouse Name: N/A    Number of Children: N/A  . Years of Education: N/A   Occupational History  . Not on file.   Social History Main Topics  . Smoking status: Never Smoker   . Smokeless tobacco: Not on file  . Alcohol Use: No  . Drug Use: No  . Sexually Active:    Other Topics Concern  . 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 ciclesonide (OMNARIS) 50 MCG/ACT nasal spray Place 2 sprays into both nostrils daily.  12.5 g  0  . fexofenadine-pseudoephedrine (ALLEGRA-D 24) 180-240 MG per 24 hr tablet Take 1 tablet by mouth daily.  30 tablet  2    No Known Allergies  Family History  Problem Relation Age of Onset  . Hypertension Mother   . Stroke Sister   . Hypertension Sister   . Other Sister     moyamoya  . Crohn's disease Daughter   . Leukemia Sister   . Graves' disease Daughter      brother and husband   dtr has hyperthyroidism  BP 124/82  Pulse 87  Temp(Src) 97.2 F (36.2 C) (Oral)  Ht 5\' 3"  (1.6 m)  Wt 184 lb (83.462 kg)  BMI  32.59 kg/m2  SpO2 97%  Review of Systems denies weight loss, headache, hoarseness, double vision, palpitations, diarrhea, polyuria, numbness, seizures, depression, and rhinorrhea.  She has some menopausal sxs, including excessive diaphoresis.  She has doe, easy bruising, and myalgias.      Objective:   Physical Exam VS: see vs page GEN: no distress HEAD: head: no deformity eyes: no periorbital swelling, no proptosis external nose and ears are normal mouth: no lesion seen NECK: supple, thyroid is not enlarged CHEST WALL: no deformity LUNGS:  Clear to auscultation CV: reg rate and rhythm, no murmur ABD: abdomen is soft, nontender.  no hepatosplenomegaly.  not distended.  no hernia MUSCULOSKELETAL: muscle bulk and strength are grossly normal.  no obvious joint swelling.  gait is normal and steady EXTEMITIES: no deformity.  no ulcer on the feet.  feet are of normal color and temp.  no edema PULSES: dorsalis pedis intact bilat.  no carotid bruit NEURO:  cn 2-12 grossly intact.   readily moves all 4's.  sensation is intact to touch on the feet.  Slight coarse tremor. SKIN:  Normal texture and temperature.  No rash or suspicious lesion is visible.   NODES:  None palpable at the neck PSYCH: alert, oriented x3.  Does not appear anxious nor depressed.    Lab Results  Component Value Date   TSH 0.04* 06/30/2011      Assessment & Plan:  Hyperthyroidism, slightly better.  uncertain etiology Menopause.  This limits interpretation of sxs Tremor, prob thyroid-related

## 2011-07-01 ENCOUNTER — Telehealth: Payer: Self-pay | Admitting: *Deleted

## 2011-07-01 DIAGNOSIS — R6889 Other general symptoms and signs: Secondary | ICD-10-CM

## 2011-07-01 NOTE — Telephone Encounter (Signed)
Pt informed of lab results, pt informed of results and lab orders placed into Epic for 2 week recheck. (letter also mailed to pt)

## 2011-07-14 ENCOUNTER — Other Ambulatory Visit (INDEPENDENT_AMBULATORY_CARE_PROVIDER_SITE_OTHER)

## 2011-07-14 ENCOUNTER — Other Ambulatory Visit: Payer: Self-pay | Admitting: Endocrinology

## 2011-07-14 DIAGNOSIS — R6889 Other general symptoms and signs: Secondary | ICD-10-CM

## 2011-07-14 DIAGNOSIS — R7989 Other specified abnormal findings of blood chemistry: Secondary | ICD-10-CM

## 2011-07-21 ENCOUNTER — Ambulatory Visit (HOSPITAL_COMMUNITY)

## 2011-07-22 ENCOUNTER — Other Ambulatory Visit (HOSPITAL_COMMUNITY)

## 2011-08-12 ENCOUNTER — Other Ambulatory Visit: Payer: Self-pay | Admitting: Endocrinology

## 2011-08-12 DIAGNOSIS — R7989 Other specified abnormal findings of blood chemistry: Secondary | ICD-10-CM

## 2011-08-19 ENCOUNTER — Encounter (HOSPITAL_COMMUNITY)
Admission: RE | Admit: 2011-08-19 | Discharge: 2011-08-19 | Disposition: A | Discharge status: Home / Self Care | Source: Ambulatory Visit | Attending: Endocrinology | Admitting: Endocrinology

## 2011-08-19 DIAGNOSIS — R7989 Other specified abnormal findings of blood chemistry: Secondary | ICD-10-CM

## 2011-08-19 DIAGNOSIS — E0590 Thyrotoxicosis, unspecified without thyrotoxic crisis or storm: Secondary | ICD-10-CM | POA: Insufficient documentation

## 2011-08-20 ENCOUNTER — Encounter (HOSPITAL_COMMUNITY)
Admission: RE | Admit: 2011-08-20 | Discharge: 2011-08-20 | Disposition: A | Discharge status: Home / Self Care | Source: Ambulatory Visit | Attending: Endocrinology | Admitting: Endocrinology

## 2011-08-20 MED ORDER — SODIUM PERTECHNETATE TC 99M INJECTION
9.0000 | Freq: Once | INTRAVENOUS | Status: AC | PRN
  Administered 2011-08-20: 9 via INTRAVENOUS

## 2011-08-20 MED ORDER — SODIUM IODIDE I 131 CAPSULE
9.3000 | Freq: Once | INTRAVENOUS | Status: AC | PRN
  Administered 2011-08-20: 9.3 via ORAL

## 2011-08-23 ENCOUNTER — Other Ambulatory Visit: Payer: Self-pay | Admitting: Endocrinology

## 2011-08-23 DIAGNOSIS — E0590 Thyrotoxicosis, unspecified without thyrotoxic crisis or storm: Secondary | ICD-10-CM | POA: Insufficient documentation

## 2011-09-04 ENCOUNTER — Ambulatory Visit (HOSPITAL_COMMUNITY)

## 2011-09-11 ENCOUNTER — Encounter (HOSPITAL_COMMUNITY): Payer: Self-pay

## 2011-09-11 ENCOUNTER — Encounter (HOSPITAL_COMMUNITY)
Admission: RE | Admit: 2011-09-11 | Discharge: 2011-09-11 | Disposition: A | Discharge status: Home / Self Care | Source: Ambulatory Visit | Attending: Endocrinology | Admitting: Endocrinology

## 2011-09-11 DIAGNOSIS — E0590 Thyrotoxicosis, unspecified without thyrotoxic crisis or storm: Secondary | ICD-10-CM | POA: Insufficient documentation

## 2011-09-11 HISTORY — DX: Thyrotoxicosis, unspecified without thyrotoxic crisis or storm: E05.90

## 2011-09-11 MED ORDER — SODIUM IODIDE I 131 CAPSULE
31.1000 | Freq: Once | INTRAVENOUS | Status: AC | PRN
  Administered 2011-09-11: 31.1 via ORAL

## 2011-10-23 ENCOUNTER — Encounter: Payer: Self-pay | Admitting: Endocrinology

## 2011-10-23 ENCOUNTER — Ambulatory Visit (INDEPENDENT_AMBULATORY_CARE_PROVIDER_SITE_OTHER): Admitting: Endocrinology

## 2011-10-23 ENCOUNTER — Other Ambulatory Visit (INDEPENDENT_AMBULATORY_CARE_PROVIDER_SITE_OTHER)

## 2011-10-23 VITALS — BP 122/80 | HR 78 | Temp 97.5°F | Resp 16 | Wt 184.5 lb

## 2011-10-23 DIAGNOSIS — E0590 Thyrotoxicosis, unspecified without thyrotoxic crisis or storm: Secondary | ICD-10-CM

## 2011-10-23 LAB — TSH: TSH: 0.02 u[IU]/mL — ABNORMAL LOW (ref 0.35–5.50)

## 2011-10-23 LAB — T4, FREE: Free T4: 1.91 ng/dL — ABNORMAL HIGH (ref 0.60–1.60)

## 2011-10-23 NOTE — Patient Instructions (Addendum)
blood tests are being requested for you today.  You will receive a letter with results.  Please come back for a follow-up appointment for 1 month.

## 2011-10-23 NOTE — Progress Notes (Signed)
  Subjective:    Patient ID: Deborah Mcdowell, female    DOB: [birth year], 51 y.o.   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 pt states she feels well in general, except for palpitations in the chest.   Past Medical History  Diagnosis Date  . Allergy   . GERD (gastroesophageal reflux disease)   . Varicella     as a child  . Vertigo, intermittent     positional neg ct scan  . History of abnormal Pap smear     atypical ASCUS neg HPV neg   . Hyperthyroidism     Past Surgical History  Procedure Date  . Colposcopy 2001  . Liposuction multiple body parts fall 2011    History   Social History  . Marital Status: Married    Spouse Name: N/A    Number of Children: N/A  . Years of Education: N/A   Occupational History  . Not on file.   Social History Main Topics  . Smoking status: Never Smoker   . Smokeless tobacco: Not on file  . Alcohol Use: No  . Drug Use: No  . Sexually Active:    Other Topics Concern  . 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 ciclesonide (OMNARIS) 50 MCG/ACT nasal spray Place 2 sprays into both nostrils daily.  12.5 g  0  . fexofenadine-pseudoephedrine (ALLEGRA-D 24) 180-240 MG per 24 hr tablet Take 1 tablet by mouth daily.  30 tablet  2    No Known Allergies  Family History  Problem Relation Age of Onset  . Hypertension Mother   . Stroke Sister   . Hypertension Sister   . Other Sister     moyamoya  . Crohn's disease Daughter   . Leukemia Sister   . Graves' disease Daughter      brother and husband     BP 122/80  Pulse 78  Temp 97.5 F (36.4 C) (Oral)  Resp 16  Wt 184 lb 8 oz (83.689 kg)  SpO2  97%  Review of Systems Denies fever    Objective:   Physical Exam VITAL SIGNS:  See vs page GENERAL: no distress NECK: There is no palpable thyroid enlargement.  No thyroid nodule is palpable.  No palpable lymphadenopathy at the anterior neck.    Lab Results  Component Value Date   TSH 0.02* 10/23/2011      Assessment & Plan:  Hyperthyroidism, not better yet

## 2011-11-03 ENCOUNTER — Encounter: Payer: Self-pay | Admitting: General Practice

## 2011-12-08 ENCOUNTER — Ambulatory Visit (INDEPENDENT_AMBULATORY_CARE_PROVIDER_SITE_OTHER): Admitting: Endocrinology

## 2011-12-08 ENCOUNTER — Encounter: Payer: Self-pay | Admitting: Endocrinology

## 2011-12-08 VITALS — BP 128/80 | HR 64 | Temp 98.8°F | Wt 193.0 lb

## 2011-12-08 DIAGNOSIS — E0590 Thyrotoxicosis, unspecified without thyrotoxic crisis or storm: Secondary | ICD-10-CM

## 2011-12-08 LAB — T4, FREE: Free T4: 0.32 ng/dL — ABNORMAL LOW (ref 0.80–1.80)

## 2011-12-08 NOTE — Patient Instructions (Addendum)
blood tests are being requested for you today.  We'll contact you with results. Please come back for a follow-up appointment for 1 month.  

## 2011-12-08 NOTE — Progress Notes (Signed)
  Subjective:    Patient ID: Deborah Mcdowell, female    DOB: Oct 18, 1960, 51 y.o.   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 pt states she feels no different, and well in general.   Past Medical History  Diagnosis Date  . Allergy   . GERD (gastroesophageal reflux disease)   . Varicella     as a child  . Vertigo, intermittent     positional neg ct scan  . History of abnormal Pap smear     atypical ASCUS neg HPV neg   . Hyperthyroidism     Past Surgical History  Procedure Date  . Colposcopy 2001  . Liposuction multiple body parts fall 2011    History   Social History  . Marital Status: Married    Spouse Name: N/A    Number of Children: N/A  . Years of Education: N/A   Occupational History  . Not on file.   Social History Main Topics  . Smoking status: Never Smoker   . Smokeless tobacco: Not on file  . Alcohol Use: No  . Drug Use: No  . Sexually Active:    Other Topics Concern  . 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 ciclesonide (OMNARIS) 50 MCG/ACT nasal spray Place 2 sprays into both nostrils daily.  12.5 g  0  . fexofenadine-pseudoephedrine (ALLEGRA-D 24) 180-240 MG per 24 hr tablet Take 1 tablet by mouth daily.  30 tablet  2    No Known Allergies  Family History  Problem Relation Age of Onset  . Hypertension Mother   . Stroke Sister   . Hypertension Sister   . Other Sister     moyamoya  . Crohn's disease Daughter   . Leukemia Sister   . Graves' disease Daughter      brother and husband     BP 128/80  Pulse 64  Temp 98.8 F (37.1 C) (Oral)  Wt 193 lb (87.544 kg)  SpO2 98%  Review of Systems  Denies  fever    Objective:   Physical Exam VITAL SIGNS:  See vs page GENERAL: no distress NECK: There is no palpable thyroid enlargement.  No thyroid nodule is palpable.  No palpable lymphadenopathy at the anterior neck.      Assessment & Plan:  Hyperthyroidism, s/p i-131 rx

## 2011-12-09 ENCOUNTER — Other Ambulatory Visit: Payer: Self-pay | Admitting: Endocrinology

## 2011-12-09 MED ORDER — LEVOTHYROXINE SODIUM 125 MCG PO TABS: 125.0000 ug | ORAL_TABLET | Freq: Every day | ORAL | Status: DC

## 2011-12-09 NOTE — Progress Notes (Signed)
lmom pt to call if any questions 

## 2012-01-11 ENCOUNTER — Ambulatory Visit (INDEPENDENT_AMBULATORY_CARE_PROVIDER_SITE_OTHER): Admitting: Endocrinology

## 2012-01-11 ENCOUNTER — Encounter: Payer: Self-pay | Admitting: Endocrinology

## 2012-01-11 VITALS — BP 126/82 | HR 81 | Temp 98.2°F | Wt 190.0 lb

## 2012-01-11 DIAGNOSIS — E890 Postprocedural hypothyroidism: Secondary | ICD-10-CM

## 2012-01-11 LAB — TSH: TSH: 0.099 u[IU]/mL — ABNORMAL LOW (ref 0.350–4.500)

## 2012-01-11 NOTE — Progress Notes (Signed)
  Subjective:    Patient ID: Deborah Mcdowell, female    DOB: 17-May-1960, 51 y.o.   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 She was started on synthroid 1 month ago.  pt states she feels no different, and well in general.  Past Medical History  Diagnosis Date  . Allergy   . GERD (gastroesophageal reflux disease)   . Varicella     as a child  . Vertigo, intermittent     positional neg ct scan  . History of abnormal Pap smear     atypical ASCUS neg HPV neg   . Hyperthyroidism     Past Surgical History  Procedure Date  . Colposcopy 2001  . Liposuction multiple body parts fall 2011    History   Social History  . Marital Status: Married    Spouse Name: N/A    Number of Children: N/A  . Years of Education: N/A   Occupational History  . Not on file.   Social History Main Topics  . Smoking status: Never Smoker   . Smokeless tobacco: Not on file  . Alcohol Use: No  . Drug Use: No  . Sexually Active:    Other Topics Concern  . 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 ciclesonide (OMNARIS) 50 MCG/ACT nasal spray Place 2 sprays into both nostrils daily.  12.5 g  0  . esomeprazole (NEXIUM) 20 MG capsule Take 20 mg by mouth daily before breakfast.      . fexofenadine-pseudoephedrine (ALLEGRA-D 24) 180-240 MG per 24 hr tablet Take 1 tablet by mouth daily.  30 tablet  2    No Known Allergies  Family History  Problem Relation Age of Onset  . Hypertension Mother   . Stroke Sister   . Hypertension Sister   . Other Sister     moyamoya  . Crohn's disease Daughter   . Leukemia Sister   . Graves' disease Daughter      brother and husband      BP 126/82  Pulse 81  Temp 98.2 F (36.8 C) (Oral)  Wt 190 lb (86.183 kg)  SpO2 97%  Review of Systems Denies weight change    Objective:   Physical Exam VITAL SIGNS:  See vs page GENERAL: no distress NECK: There is no palpable thyroid enlargement.  No thyroid nodule is palpable.  No palpable lymphadenopathy at the anterior neck.  Lab Results  Component Value Date   TSH 0.099* 01/11/2012      Assessment & Plan:  Post-i-131 hypothyroidism, overreplaced

## 2012-01-11 NOTE — Patient Instructions (Addendum)
blood tests are being requested for you today.  We'll contact you with results. Please come back for a follow-up appointment in 6-8 weeks. Please continue the same medication.

## 2012-01-12 ENCOUNTER — Telehealth: Payer: Self-pay | Admitting: *Deleted

## 2012-01-12 MED ORDER — LEVOTHYROXINE SODIUM 100 MCG PO TABS: 100.0000 ug | ORAL_TABLET | Freq: Every day | ORAL | Status: DC

## 2012-01-12 NOTE — Telephone Encounter (Signed)
PATIENT NOTIFIED OF TSH RESULTS AND CHANGE IN MEDICATION . Rx SENT TO HER PHARMACY PER DR. ELLISON.

## 2012-01-12 NOTE — Telephone Encounter (Signed)
Message copied by Elnora Morrison on Tue Jan 12, 2012  8:31 AM ------      Message from: Romero Belling      Created: Tue Jan 12, 2012  7:51 AM       please call patient:      We need to reduce thyroid medication.      i have sent a prescription to your pharmacy      i'll see you next time.

## 2012-04-20 ENCOUNTER — Other Ambulatory Visit: Payer: Self-pay | Admitting: *Deleted

## 2012-04-20 MED ORDER — LEVOTHYROXINE SODIUM 100 MCG PO TABS: 100.0000 ug | ORAL_TABLET | Freq: Every day | ORAL | Status: DC

## 2012-05-09 ENCOUNTER — Other Ambulatory Visit (INDEPENDENT_AMBULATORY_CARE_PROVIDER_SITE_OTHER)

## 2012-05-09 DIAGNOSIS — E785 Hyperlipidemia, unspecified: Secondary | ICD-10-CM

## 2012-05-09 DIAGNOSIS — Z0000 Encounter for general adult medical examination without abnormal findings: Secondary | ICD-10-CM

## 2012-05-09 LAB — CBC WITH DIFFERENTIAL/PLATELET
Basophils Relative: 0.8 % (ref 0.0–3.0)
Eosinophils Absolute: 0.4 10*3/uL (ref 0.0–0.7)
Eosinophils Relative: 6.9 % — ABNORMAL HIGH (ref 0.0–5.0)
Lymphocytes Relative: 30.9 % (ref 12.0–46.0)
MCHC: 32.2 g/dL (ref 30.0–36.0)
Neutrophils Relative %: 52.9 % (ref 43.0–77.0)
Platelets: 291 10*3/uL (ref 150.0–400.0)
RBC: 5.17 Mil/uL — ABNORMAL HIGH (ref 3.87–5.11)
WBC: 6.3 10*3/uL (ref 4.5–10.5)

## 2012-05-09 LAB — HEPATIC FUNCTION PANEL
Alkaline Phosphatase: 75 U/L (ref 39–117)
Bilirubin, Direct: 0.1 mg/dL (ref 0.0–0.3)
Total Bilirubin: 0.5 mg/dL (ref 0.3–1.2)
Total Protein: 7.6 g/dL (ref 6.0–8.3)

## 2012-05-09 LAB — BASIC METABOLIC PANEL
CO2: 26 mEq/L (ref 19–32)
Chloride: 104 mEq/L (ref 96–112)
Potassium: 4.3 mEq/L (ref 3.5–5.1)
Sodium: 139 mEq/L (ref 135–145)

## 2012-05-09 LAB — LDL CHOLESTEROL, DIRECT: Direct LDL: 156.6 mg/dL

## 2012-05-09 LAB — LIPID PANEL
Cholesterol: 208 mg/dL — ABNORMAL HIGH (ref 0–200)
Total CHOL/HDL Ratio: 6
Triglycerides: 61 mg/dL (ref 0.0–149.0)
VLDL: 12.2 mg/dL (ref 0.0–40.0)

## 2012-05-16 ENCOUNTER — Ambulatory Visit (INDEPENDENT_AMBULATORY_CARE_PROVIDER_SITE_OTHER): Admitting: Internal Medicine

## 2012-05-16 ENCOUNTER — Encounter: Payer: Self-pay | Admitting: Internal Medicine

## 2012-05-16 VITALS — BP 128/90 | HR 73 | Temp 98.0°F | Ht 62.75 in | Wt 192.0 lb

## 2012-05-16 DIAGNOSIS — K219 Gastro-esophageal reflux disease without esophagitis: Secondary | ICD-10-CM

## 2012-05-16 DIAGNOSIS — E785 Hyperlipidemia, unspecified: Secondary | ICD-10-CM

## 2012-05-16 DIAGNOSIS — Z0000 Encounter for general adult medical examination without abnormal findings: Secondary | ICD-10-CM | POA: Insufficient documentation

## 2012-05-16 DIAGNOSIS — E890 Postprocedural hypothyroidism: Secondary | ICD-10-CM

## 2012-05-16 DIAGNOSIS — R030 Elevated blood-pressure reading, without diagnosis of hypertension: Secondary | ICD-10-CM | POA: Insufficient documentation

## 2012-05-16 MED ORDER — ESOMEPRAZOLE MAGNESIUM 20 MG PO CPDR: 20.0000 mg | DELAYED_RELEASE_CAPSULE | Freq: Every day | ORAL | Status: DC

## 2012-05-16 NOTE — Progress Notes (Signed)
Chief Complaint  Patient presents with  . Annual Exam    HPI: Patient comes in today for Preventive Health Care visit  No major change in health status since last visit . Has had thyroid ablation 7 13  And fu dr e  And on  replace ? Not stable yet  Doing ok  Little prob with gerd   New job   bp 130/90   [place]   Getting  To do rootating shifts   Every 4 weeks.   [place].  nexium as needed. Left knee problem  Not enough exercising no injury  ROS:  GEN/ HEENT: No fever, significant weight changes sweats headaches vision problems hearing changes, CV/ PULM; No chest pain shortness of breath cough, syncope,edema  change in exercise tolerance. GI /GU: No adominal pain, vomiting, change in bowel habits. No blood in the stool. No significant GU symptoms. SKIN/HEME: ,no acute skin rashes suspicious lesions or bleeding. No lymphadenopathy, nodules, masses.  NEURO/ PSYCH:  No neurologic signs such as weakness numbness. No depression anxiety. IMM/ Allergy: No unusual infections.  Allergy .   REST of 12 system review negative except as per HPI   Past Medical History  Diagnosis Date  . Allergy   . GERD (gastroesophageal reflux disease)   . Varicella     as a child  . Vertigo, intermittent     positional neg ct scan  . History of abnormal Pap smear     atypical ASCUS neg HPV neg   . Hyperthyroidism     Family History  Problem Relation Age of Onset  . Hypertension Mother   . Stroke Sister   . Hypertension Sister   . Other Sister     moyamoya  . Crohn's disease Daughter   . Leukemia Sister   . Graves' disease Daughter      brother and husband     History   Social History  . Marital Status: Married    Spouse Name: N/A    Number of Children: N/A  . Years of Education: N/A   Social History Main Topics  . Smoking status: Never Smoker   . Smokeless tobacco: None  . Alcohol Use: No  . Drug Use: No  . Sexually Active:    Other Topics Concern  . 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    Changing to [place] rotating shifts   fuull time end of April    Married   HH of 2  Husband works [employer]     Gaffer back from MD 2009   Non  Smoker   Husband had vascectomy    Outpatient Encounter Prescriptions as of 05/16/2012  Medication Sig Dispense Refill  . esomeprazole (NEXIUM) 20 MG capsule Take 1 capsule (20 mg total) by mouth daily before breakfast.  30 capsule  5  . levothyroxine (SYNTHROID) 100 MCG tablet Take 1 tablet (100 mcg total) by mouth daily.  30 tablet  11  . [DISCONTINUED] esomeprazole (NEXIUM) 20 MG capsule Take 20 mg by mouth daily before breakfast.      . [DISCONTINUED] ciclesonide (OMNARIS) 50 MCG/ACT nasal spray Place 2 sprays into both nostrils daily.  12.5 g  0   No facility-administered encounter medications on file as of 05/16/2012.    EXAM:  BP 128/90  Pulse 73  Temp(Src) 98 F (36.7 C) (Oral)  Ht 5' 2.75" (1.594  m)  Wt 192 lb (87.091 kg)  BMI 34.28 kg/m2  SpO2 98%  Body mass index is 34.28 kg/(m^2).  Physical Exam: Vital signs reviewed [id number] is a well-developed well-nourished alert cooperative   female who appears her stated age in no acute distress.  HEENT: normocephalic atraumatic , Eyes: PERRL EOM's full, conjunctiva clear, Nares: paten,t no deformity discharge or tenderness., Ears: no deformity EAC's clear TMs with normal landmarks. Mouth: clear OP, no lesions, edema.  Moist mucous membranes. Dentition in adequate repair. NECK: supple without masses, thyromegaly or bruits. Breast: normal by inspection . No dimpling, discharge, masses, tenderness or discharge . CHEST/PULM:  Clear to auscultation and percussion breath sounds equal no wheeze , rales or rhonchi. No chest wall deformities or tenderness. CV: PMI is nondisplaced, S1 S2 no gallops, murmurs, rubs. Peripheral pulses are full without delay.No JVD .  ABDOMEN: Bowel sounds normal  nontender  No guard or rebound, no hepato splenomegal no CVA tenderness.  No hernia. Extremtities:  No clubbing cyanosis or edema, no acute joint swelling or redness no focal atrophy NEURO:  Oriented x3, cranial nerves 3-12 appear to be intact, no obvious focal weakness,gait within normal limits no abnormal reflexes or asymmetrical SKIN: No acute rashes normal turgor, color, no bruising or petechiae. PSYCH: Oriented, good eye contact, no obvious depression anxiety, cognition and judgment appear normal. LN: no cervical axillary inguinal adenopathy  Lab Results  Component Value Date   WBC 6.3 05/09/2012   HGB 13.2 05/09/2012   HCT 41.1 05/09/2012   PLT 291.0 05/09/2012   GLUCOSE 84 05/09/2012   CHOL 208* 05/09/2012   TRIG 61.0 05/09/2012   HDL 37.30* 05/09/2012   LDLDIRECT 156.6 05/09/2012   LDLCALC 106* 05/07/2011   ALT 22 05/09/2012   AST 32 05/09/2012   NA 139 05/09/2012   K 4.3 05/09/2012   CL 104 05/09/2012   CREATININE 1.0 05/09/2012   BUN 16 05/09/2012   CO2 26 05/09/2012   TSH 0.92 05/09/2012    ASSESSMENT AND PLAN:  Discussed the following assessment and plan:  Visit for preventive health examination  Other and unspecified hyperlipidemia  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 Follow up with dr Lorane Gell for thyroid and when stable we can take over management.  consider recheck ipid panel after  lifestyle intervention healthy eating and exercise .  Monitor BP readings    DASH diet helpful  As well as weight loss and exercise to help BP readings.   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   Preventive Care for Adults, Female A healthy lifestyle and preventive care can promote health and wellness.  Preventive health guidelines for women include the following key practices.  A routine yearly physical is a good way to check with your caregiver about your health and preventive screening. It is a chance to share any concerns and updates on your health, and to receive a thorough exam.  Visit your dentist for a routine exam and preventive care every 6 months. Brush your teeth twice a day and floss once a day. Good oral hygiene prevents tooth decay and gum disease.  The frequency of eye exams is based on your age, health, family medical history, use of contact  lenses, and other factors. Follow your caregiver's recommendations for frequency of eye exams.  Eat a healthy diet. Foods like vegetables, fruits, whole grains, low-fat dairy products, and lean protein foods contain the nutrients you need without too many calories. Decrease your intake of foods high in solid fats, added sugars, and salt. Eat the right amount of calories for you.Get information about a proper diet from your caregiver, if necessary.  Regular physical exercise is one of the most important things you can do for your health. Most adults should get at least 150 minutes of moderate-intensity exercise (any activity that increases your heart rate and causes you to sweat) each week. In addition, most adults need muscle-strengthening exercises on 2 or more days a week.  Maintain a healthy weight. The body mass index (BMI) is a screening tool to identify possible weight problems. It provides an estimate of body fat based on height and weight. Your caregiver can help determine your BMI, and can help you achieve or maintain a healthy weight.For adults 20 years and older:  A BMI below 18.5 is considered underweight.  A BMI of 18.5 to 24.9 is normal.  A BMI of 25 to 29.9 is considered overweight.  A BMI of 30 and above is considered obese.  Maintain normal blood lipids and cholesterol levels by exercising and minimizing your intake of  saturated fat. Eat a balanced diet with plenty of fruit and vegetables. Blood tests for lipids and cholesterol should begin at age 25 and be repeated every 5 years. If your lipid or cholesterol levels are high, you are over 50, or you are at high risk for heart disease, you may need your cholesterol levels checked more frequently.Ongoing high lipid and cholesterol levels should be treated with medicines if diet and exercise are not effective.  If you smoke, find out from your caregiver how to quit. If you do not use tobacco, do not start.  If you are pregnant, do not drink alcohol. If you are breastfeeding, be very cautious about drinking alcohol. If you are not pregnant and choose to drink alcohol, do not exceed 1 drink per day. One drink is considered to be 12 ounces (355 mL) of beer, 5 ounces (148 mL) of wine, or 1.5 ounces (44 mL) of liquor.  Avoid use of street drugs. Do not share needles with anyone. Ask for help if you need support or instructions about stopping the use of drugs.  High blood pressure causes heart disease and increases the risk of stroke. Your blood pressure should be checked at least every 1 to 2 years. Ongoing high blood pressure should be treated with medicines if weight loss and exercise are not effective.  If you are 32 to 50 years old, ask your caregiver if you should take aspirin to prevent strokes.  Diabetes screening involves taking a blood sample to check your fasting blood sugar level. This should be done once every 3 years, after age 74, if you are within normal weight and without risk factors for diabetes. Testing should be considered at a younger age or be carried out more frequently if you are overweight and have at least 1 risk factor for diabetes.  Breast cancer screening is essential preventive care for women. You should practice "breast self-awareness." This means understanding the normal appearance and feel of your breasts and may include breast  self-examination. Any changes detected, no matter how small, should be reported to a caregiver. Women in their 85s and 30s should have a  clinical breast exam (CBE) by a caregiver as part of a regular health exam every 1 to 3 years. After age 58, women should have a CBE every year. Starting at age 49, women should consider having a mammography (breast X-ray test) every year. Women who have a family history of breast cancer should talk to their caregiver about genetic screening. Women at a high risk of breast cancer should talk to their caregivers about having magnetic resonance imaging (MRI) and a mammography every year.  The Pap test is a screening test for cervical cancer. A Pap test can show cell changes on the cervix that might become cervical cancer if left untreated. A Pap test is a procedure in which cells are obtained and examined from the lower end of the uterus (cervix).  Women should have a Pap test starting at age 44.  Between ages 67 and 35, Pap tests should be repeated every 2 years.  Beginning at age 8, you should have a Pap test every 3 years as long as the past 3 Pap tests have been normal.  Some women have medical problems that increase the chance of getting cervical cancer. Talk to your caregiver about these problems. It is especially important to talk to your caregiver if a new problem develops soon after your last Pap test. In these cases, your caregiver may recommend more frequent screening and Pap tests.  The above recommendations are the same for women who have or have not gotten the vaccine for human papillomavirus (HPV).  If you had a hysterectomy for a problem that was not cancer or a condition that could lead to cancer, then you no longer need Pap tests. Even if you no longer need a Pap test, a regular exam is a good idea to make sure no other problems are starting.  If you are between ages 58 and 70, and you have had normal Pap tests going back 10 years, you no longer  need Pap tests. Even if you no longer need a Pap test, a regular exam is a good idea to make sure no other problems are starting.  If you have had past treatment for cervical cancer or a condition that could lead to cancer, you need Pap tests and screening for cancer for at least 20 years after your treatment.  If Pap tests have been discontinued, risk factors (such as a new sexual partner) need to be reassessed to determine if screening should be resumed.  The HPV test is an additional test that may be used for cervical cancer screening. The HPV test looks for the virus that can cause the cell changes on the cervix. The cells collected during the Pap test can be tested for HPV. The HPV test could be used to screen women aged 1 years and older, and should be used in women of any age who have unclear Pap test results. After the age of 66, women should have HPV testing at the same frequency as a Pap test.  Colorectal cancer can be detected and often prevented. Most routine colorectal cancer screening begins at the age of 33 and continues through age 45. However, your caregiver may recommend screening at an earlier age if you have risk factors for colon cancer. On a yearly basis, your caregiver may provide home test kits to check for hidden blood in the stool. Use of a small camera at the end of a tube, to directly examine the colon (sigmoidoscopy or colonoscopy), can detect the earliest forms  of colorectal cancer. Talk to your caregiver about this at age 75, when routine screening begins. Direct examination of the colon should be repeated every 5 to 10 years through age 67, unless early forms of pre-cancerous polyps or small growths are found.  Hepatitis C blood testing is recommended for all people born from [birth year] through 1965 and any individual with known risks for hepatitis C.  Practice safe sex. Use condoms and avoid high-risk sexual practices to reduce the spread of sexually transmitted infections  (STIs). STIs include gonorrhea, chlamydia, syphilis, trichomonas, herpes, HPV, and human immunodeficiency virus (HIV). Herpes, HIV, and HPV are viral illnesses that have no cure. They can result in disability, cancer, and death. Sexually active women aged 72 and younger should be checked for chlamydia. Older women with new or multiple partners should also be tested for chlamydia. Testing for other STIs is recommended if you are sexually active and at increased risk.  Osteoporosis is a disease in which the bones lose minerals and strength with aging. This can result in serious bone fractures. The risk of osteoporosis can be identified using a bone density scan. Women ages 81 and over and women at risk for fractures or osteoporosis should discuss screening with their caregivers. Ask your caregiver whether you should take a calcium supplement or vitamin D to reduce the rate of osteoporosis.  Menopause can be associated with physical symptoms and risks. Hormone replacement therapy is available to decrease symptoms and risks. You should talk to your caregiver about whether hormone replacement therapy is right for you.  Use sunscreen with sun protection factor (SPF) of 30 or more. Apply sunscreen liberally and repeatedly throughout the day. You should seek shade when your shadow is shorter than you. Protect yourself by wearing long sleeves, pants, a wide-brimmed hat, and sunglasses year round, whenever you are outdoors.  Once a month, do a whole body skin exam, using a mirror to look at the skin on your back. Notify your caregiver of new moles, moles that have irregular borders, moles that are larger than a pencil eraser, or moles that have changed in shape or color.  Stay current with required immunizations.  Influenza. You need a dose every fall (or winter). The composition of the flu vaccine changes each year, so being vaccinated once is not enough.  Pneumococcal polysaccharide. You need 1 to 2 doses if  you smoke cigarettes or if you have certain chronic medical conditions. You need 1 dose at age 69 (or older) if you have never been vaccinated.  Tetanus, diphtheria, pertussis (Tdap, Td). Get 1 dose of Tdap vaccine if you are younger than age 78, are over 83 and have contact with an infant, are a Research scientist (physical sciences), are pregnant, or simply want to be protected from whooping cough. After that, you need a Td booster dose every 10 years. Consult your caregiver if you have not had at least 3 tetanus and diphtheria-containing shots sometime in your life or have a deep or dirty wound.  HPV. You need this vaccine if you are a woman age 73 or younger. The vaccine is given in 3 doses over 6 months.  Measles, mumps, rubella (MMR). You need at least 1 dose of MMR if you were born in 1957 or later. You may also need a second dose.  Meningococcal. If you are age 37 to 80 and a first-year college student living in a residence hall, or have one of several medical conditions, you need to get vaccinated against meningococcal  disease. You may also need additional booster doses.  Zoster (shingles). If you are age 19 or older, you should get this vaccine.  Varicella (chickenpox). If you have never had chickenpox or you were vaccinated but received only 1 dose, talk to your caregiver to find out if you need this vaccine.  Hepatitis A. You need this vaccine if you have a specific risk factor for hepatitis A virus infection or you simply wish to be protected from this disease. The vaccine is usually given as 2 doses, 6 to 18 months apart.  Hepatitis B. You need this vaccine if you have a specific risk factor for hepatitis B virus infection or you simply wish to be protected from this disease. The vaccine is given in 3 doses, usually over 6 months. Preventive Services / Frequency Ages 17 to 52  Blood pressure check.** / Every 1 to 2 years.  Lipid and cholesterol check.** / Every 5 years beginning at age 84.  Clinical  breast exam.** / Every 3 years for women in their 15s and 30s.  Pap test.** / Every 2 years from ages 20 through 36. Every 3 years starting at age 86 through age 35 or 52 with a history of 3 consecutive normal Pap tests.  HPV screening.** / Every 3 years from ages 14 through ages 67 to 19 with a history of 3 consecutive normal Pap tests.  Hepatitis C blood test.** / For any individual with known risks for hepatitis C.  Skin self-exam. / Monthly.  Influenza immunization.** / Every year.  Pneumococcal polysaccharide immunization.** / 1 to 2 doses if you smoke cigarettes or if you have certain chronic medical conditions.  Tetanus, diphtheria, pertussis (Tdap, Td) immunization. / A one-time dose of Tdap vaccine. After that, you need a Td booster dose every 10 years.  HPV immunization. / 3 doses over 6 months, if you are 49 and younger.  Measles, mumps, rubella (MMR) immunization. / You need at least 1 dose of MMR if you were born in 1957 or later. You may also need a second dose.  Meningococcal immunization. / 1 dose if you are age 75 to 85 and a first-year college student living in a residence hall, or have one of several medical conditions, you need to get vaccinated against meningococcal disease. You may also need additional booster doses.  Varicella immunization.** / Consult your caregiver.  Hepatitis A immunization.** / Consult your caregiver. 2 doses, 6 to 18 months apart.  Hepatitis B immunization.** / Consult your caregiver. 3 doses usually over 6 months. Ages 27 to 24  Blood pressure check.** / Every 1 to 2 years.  Lipid and cholesterol check.** / Every 5 years beginning at age 54.  Clinical breast exam.** / Every year after age 49.  Mammogram.** / Every year beginning at age 80 and continuing for as long as you are in good health. Consult with your caregiver.  Pap test.** / Every 3 years starting at age 89 through age 61 or 48 with a history of 3 consecutive normal Pap  tests.  HPV screening.** / Every 3 years from ages 63 through ages 43 to 50 with a history of 3 consecutive normal Pap tests.  Fecal occult blood test (FOBT) of stool. / Every year beginning at age 79 and continuing until age 53. You may not need to do this test if you get a colonoscopy every 10 years.  Flexible sigmoidoscopy or colonoscopy.** / Every 5 years for a flexible sigmoidoscopy or every 10  years for a colonoscopy beginning at age 3 and continuing until age 85.  Hepatitis C blood test.** / For all people born from [birth year] through 1963 and any individual with known risks for hepatitis C.  Skin self-exam. / Monthly.  Influenza immunization.** / Every year.  Pneumococcal polysaccharide immunization.** / 1 to 2 doses if you smoke cigarettes or if you have certain chronic medical conditions.  Tetanus, diphtheria, pertussis (Tdap, Td) immunization.** / A one-time dose of Tdap vaccine. After that, you need a Td booster dose every 10 years.  Measles, mumps, rubella (MMR) immunization. / You need at least 1 dose of MMR if you were born in 1957 or later. You may also need a second dose.  Varicella immunization.** / Consult your caregiver.  Meningococcal immunization.** / Consult your caregiver.  Hepatitis A immunization.** / Consult your caregiver. 2 doses, 6 to 18 months apart.  Hepatitis B immunization.** / Consult your caregiver. 3 doses, usually over 6 months. Ages 38 and over  Blood pressure check.** / Every 1 to 2 years.  Lipid and cholesterol check.** / Every 5 years beginning at age 61.  Clinical breast exam.** / Every year after age 73.  Mammogram.** / Every year beginning at age 8 and continuing for as long as you are in good health. Consult with your caregiver.  Pap test.** / Every 3 years starting at age 85 through age 17 or 40 with a 3 consecutive normal Pap tests. Testing can be stopped between 65 and 70 with 3 consecutive normal Pap tests and no abnormal Pap or HPV  tests in the past 10 years.  HPV screening.** / Every 3 years from ages 60 through ages 28 or 73 with a history of 3 consecutive normal Pap tests. Testing can be stopped between 65 and 70 with 3 consecutive normal Pap tests and no abnormal Pap or HPV tests in the past 10 years.  Fecal occult blood test (FOBT) of stool. / Every year beginning at age 9 and continuing until age 108. You may not need to do this test if you get a colonoscopy every 10 years.  Flexible sigmoidoscopy or colonoscopy.** / Every 5 years for a flexible sigmoidoscopy or every 10 years for a colonoscopy beginning at age 75 and continuing until age 32.  Hepatitis C blood test.** / For all people born from [birth year] through 1963 and any individual with known risks for hepatitis C.  Osteoporosis screening.** / A one-time screening for women ages 61 and over and women at risk for fractures or osteoporosis.  Skin self-exam. / Monthly.  Influenza immunization.** / Every year.  Pneumococcal polysaccharide immunization.** / 1 dose at age 70 (or older) if you have never been vaccinated.  Tetanus, diphtheria, pertussis (Tdap, Td) immunization. / A one-time dose of Tdap vaccine if you are over 65 and have contact with an infant, are a Research scientist (physical sciences), or simply want to be protected from whooping cough. After that, you need a Td booster dose every 10 years.  Varicella immunization.** / Consult your caregiver.  Meningococcal immunization.** / Consult your caregiver.  Hepatitis A immunization.** / Consult your caregiver. 2 doses, 6 to 18 months apart.  Hepatitis B immunization.** / Check with your caregiver. 3 doses, usually over 6 months. ** Family history and personal history of risk and conditions may change your caregiver's recommendations. Document Released: 03/17/2001 Document Revised: 04/13/2011 Document Reviewed: 06/16/2010 [facility] Patient Information 2013 [place], [place].   DASH Diet The DASH diet stands for "Dietary  Approaches  to Stop Hypertension." It is a healthy eating plan that has been shown to reduce high blood pressure (hypertension) in as little as 14 days, while also possibly providing other significant health benefits. These other health benefits include reducing the risk of breast cancer after menopause and reducing the risk of type 2 diabetes, heart disease, colon cancer, and stroke. Health benefits also include weight loss and slowing kidney failure in patients with chronic kidney disease.  DIET GUIDELINES  Limit salt (sodium). Your diet should contain less than 1500 mg of sodium daily.  Limit refined or processed carbohydrates. Your diet should include mostly whole grains. Desserts and added sugars should be used sparingly.  Include small amounts of heart-healthy fats. These types of fats include nuts, oils, and tub margarine. Limit saturated and trans fats. These fats have been shown to be harmful in the body. CHOOSING FOODS  The following food groups are based on a 2000 calorie diet. See your Registered Dietitian for individual calorie needs. Grains and Grain Products (6 to 8 servings daily)  Eat More Often: Whole-wheat bread, brown rice, whole-grain or wheat pasta, quinoa, popcorn without added fat or salt (air popped).  Eat Less Often: White bread, white pasta, white rice, cornbread. Vegetables (4 to 5 servings daily)  Eat More Often: Fresh, frozen, and canned vegetables. Vegetables may be raw, steamed, roasted, or grilled with a minimal amount of fat.  Eat Less Often/Avoid: Creamed or fried vegetables. Vegetables in a cheese sauce. Fruit (4 to 5 servings daily)  Eat More Often: All fresh, canned (in natural juice), or frozen fruits. Dried fruits without added sugar. One hundred percent fruit juice ( cup [237 mL] daily).  Eat Less Often: Dried fruits with added sugar. Canned fruit in light or heavy syrup. [employer], Fish, and Poultry (2 servings or less daily. One serving is 3 to 4  oz [85-114 g]).  Eat More Often: Ninety percent or leaner ground beef, tenderloin, sirloin. Round cuts of beef, chicken breast, [place] breast. All fish. Grill, bake, or broil your meat. Nothing should be fried.  Eat Less Often/Avoid: Fatty cuts of meat, [place], or chicken leg, thigh, or wing. Fried cuts of meat or fish. Dairy (2 to 3 servings)  Eat More Often: Low-fat or fat-free milk, low-fat plain or light yogurt, reduced-fat or part-skim cheese.  Eat Less Often/Avoid: Milk (whole, 2%).Whole milk yogurt. Full-fat cheeses. Nuts, Seeds, and Legumes (4 to 5 servings per week)  Eat More Often: All without added salt.  Eat Less Often/Avoid: Salted nuts and seeds, canned beans with added salt. Fats and Sweets (limited)  Eat More Often: Vegetable oils, tub margarines without trans fats, sugar-free gelatin. Mayonnaise and salad dressings.  Eat Less Often/Avoid: Coconut oils, palm oils, butter, stick margarine, cream, half and half, cookies, candy, pie. FOR MORE INFORMATION The Dash Diet Eating Plan: www.dashdiet.org Document Released: 01/08/2011 Document Revised: 04/13/2011 Document Reviewed: 01/08/2011 [facility] Patient Information 2013 [place], [place].     Neta Mends. Braylinn Gulden M.D.  Health Maintenance  Topic Date Due  . Colonoscopy  04/19/2012  . Mammogram  04/19/2012  . Influenza Vaccine  10/03/2012  . Pap Smear  05/14/2014  . Tetanus/tdap  02/02/2017   Health Maintenance Review

## 2012-05-16 NOTE — Patient Instructions (Addendum)
Intensify lifestyle interventions. Follow up with dr Lorane Gell for thyroid and when stable we can take over management.  consider recheck ipid panel after  lifestyle intervention healthy eating and exercise .  Monitor BP readings    DASH diet helpful  As well as weight loss and exercise to help BP readings.   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   Preventive Care for Adults, Female A healthy lifestyle and preventive care can promote health and wellness. Preventive health guidelines for women include the following key practices.  A routine yearly physical is a good way to check with your caregiver about your health and preventive screening. It is a chance to share any concerns and updates on your health, and to receive a thorough exam.  Visit your dentist for a routine exam and preventive care every 6 months. Brush your teeth twice a day and floss once a day. Good oral hygiene prevents tooth decay and gum disease.  The frequency of eye exams is based on your age, health, family medical history, use of contact lenses, and other factors. Follow your caregiver's recommendations for frequency of eye exams.  Eat a healthy diet. Foods like vegetables, fruits, whole grains, low-fat dairy products, and lean protein foods contain the nutrients you need without too many calories. Decrease your intake of foods high in solid fats, added sugars, and salt. Eat the right amount of calories for you.Get information about a proper diet from your caregiver, if necessary.  Regular physical exercise is one of the most important things you can do for your health. Most adults should get at least 150 minutes of moderate-intensity exercise (any activity that increases your heart rate and causes you to sweat) each week. In addition, most adults need muscle-strengthening exercises on 2 or more days a week.  Maintain a healthy weight. The body mass index (BMI) is a screening tool to  identify possible weight problems. It provides an estimate of body fat based on height and weight. Your caregiver can help determine your BMI, and can help you achieve or maintain a healthy weight.For adults 20 years and older:  A BMI below 18.5 is considered underweight.  A BMI of 18.5 to 24.9 is normal.  A BMI of 25 to 29.9 is considered overweight.  A BMI of 30 and above is considered obese.  Maintain normal blood lipids and cholesterol levels by exercising and minimizing your intake of saturated fat. Eat a balanced diet with plenty of fruit and vegetables. Blood tests for lipids and cholesterol should begin at age 66 and be repeated every 5 years. If your lipid or cholesterol levels are high, you are over 50, or you are at high risk for heart disease, you may need your cholesterol levels checked more frequently.Ongoing high lipid and cholesterol levels should be treated with medicines if diet and exercise are not effective.  If you smoke, find out from your caregiver how to quit. If you do not use tobacco, do not start.  If you are pregnant, do not drink alcohol. If you are breastfeeding, be very cautious about drinking alcohol. If you are not pregnant and choose to drink alcohol, do not exceed 1 drink per day. One drink is considered to be 12 ounces (355 mL) of beer, 5 ounces (148 mL) of wine, or 1.5 ounces (44 mL) of liquor.  Avoid use of street drugs. Do not share needles with anyone. Ask for help if you need support or  instructions about stopping the use of drugs.  High blood pressure causes heart disease and increases the risk of stroke. Your blood pressure should be checked at least every 1 to 2 years. Ongoing high blood pressure should be treated with medicines if weight loss and exercise are not effective.  If you are 10 to 50 years old, ask your caregiver if you should take aspirin to prevent strokes.  Diabetes screening involves taking a blood sample to check your fasting blood  sugar level. This should be done once every 3 years, after age 75, if you are within normal weight and without risk factors for diabetes. Testing should be considered at a younger age or be carried out more frequently if you are overweight and have at least 1 risk factor for diabetes.  Breast cancer screening is essential preventive care for women. You should practice "breast self-awareness." This means understanding the normal appearance and feel of your breasts and may include breast self-examination. Any changes detected, no matter how small, should be reported to a caregiver. Women in their 37s and 30s should have a clinical breast exam (CBE) by a caregiver as part of a regular health exam every 1 to 3 years. After age 42, women should have a CBE every year. Starting at age 30, women should consider having a mammography (breast X-ray test) every year. Women who have a family history of breast cancer should talk to their caregiver about genetic screening. Women at a high risk of breast cancer should talk to their caregivers about having magnetic resonance imaging (MRI) and a mammography every year.  The Pap test is a screening test for cervical cancer. A Pap test can show cell changes on the cervix that might become cervical cancer if left untreated. A Pap test is a procedure in which cells are obtained and examined from the lower end of the uterus (cervix).  Women should have a Pap test starting at age 17.  Between ages 66 and 75, Pap tests should be repeated every 2 years.  Beginning at age 76, you should have a Pap test every 3 years as long as the past 3 Pap tests have been normal.  Some women have medical problems that increase the chance of getting cervical cancer. Talk to your caregiver about these problems. It is especially important to talk to your caregiver if a new problem develops soon after your last Pap test. In these cases, your caregiver may recommend more frequent screening and Pap  tests.  The above recommendations are the same for women who have or have not gotten the vaccine for human papillomavirus (HPV).  If you had a hysterectomy for a problem that was not cancer or a condition that could lead to cancer, then you no longer need Pap tests. Even if you no longer need a Pap test, a regular exam is a good idea to make sure no other problems are starting.  If you are between ages 26 and 85, and you have had normal Pap tests going back 10 years, you no longer need Pap tests. Even if you no longer need a Pap test, a regular exam is a good idea to make sure no other problems are starting.  If you have had past treatment for cervical cancer or a condition that could lead to cancer, you need Pap tests and screening for cancer for at least 20 years after your treatment.  If Pap tests have been discontinued, risk factors (such as a new sexual  partner) need to be reassessed to determine if screening should be resumed.  The HPV test is an additional test that may be used for cervical cancer screening. The HPV test looks for the virus that can cause the cell changes on the cervix. The cells collected during the Pap test can be tested for HPV. The HPV test could be used to screen women aged 29 years and older, and should be used in women of any age who have unclear Pap test results. After the age of 35, women should have HPV testing at the same frequency as a Pap test.  Colorectal cancer can be detected and often prevented. Most routine colorectal cancer screening begins at the age of 65 and continues through age 10. However, your caregiver may recommend screening at an earlier age if you have risk factors for colon cancer. On a yearly basis, your caregiver may provide home test kits to check for hidden blood in the stool. Use of a small camera at the end of a tube, to directly examine the colon (sigmoidoscopy or colonoscopy), can detect the earliest forms of colorectal cancer. Talk to your  caregiver about this at age 53, when routine screening begins. Direct examination of the colon should be repeated every 5 to 10 years through age 69, unless early forms of pre-cancerous polyps or small growths are found.  Hepatitis C blood testing is recommended for all people born from [birth year] through 1965 and any individual with known risks for hepatitis C.  Practice safe sex. Use condoms and avoid high-risk sexual practices to reduce the spread of sexually transmitted infections (STIs). STIs include gonorrhea, chlamydia, syphilis, trichomonas, herpes, HPV, and human immunodeficiency virus (HIV). Herpes, HIV, and HPV are viral illnesses that have no cure. They can result in disability, cancer, and death. Sexually active women aged 30 and younger should be checked for chlamydia. Older women with new or multiple partners should also be tested for chlamydia. Testing for other STIs is recommended if you are sexually active and at increased risk.  Osteoporosis is a disease in which the bones lose minerals and strength with aging. This can result in serious bone fractures. The risk of osteoporosis can be identified using a bone density scan. Women ages 78 and over and women at risk for fractures or osteoporosis should discuss screening with their caregivers. Ask your caregiver whether you should take a calcium supplement or vitamin D to reduce the rate of osteoporosis.  Menopause can be associated with physical symptoms and risks. Hormone replacement therapy is available to decrease symptoms and risks. You should talk to your caregiver about whether hormone replacement therapy is right for you.  Use sunscreen with sun protection factor (SPF) of 30 or more. Apply sunscreen liberally and repeatedly throughout the day. You should seek shade when your shadow is shorter than you. Protect yourself by wearing long sleeves, pants, a wide-brimmed hat, and sunglasses year round, whenever you are outdoors.  Once a month,  do a whole body skin exam, using a mirror to look at the skin on your back. Notify your caregiver of new moles, moles that have irregular borders, moles that are larger than a pencil eraser, or moles that have changed in shape or color.  Stay current with required immunizations.  Influenza. You need a dose every fall (or winter). The composition of the flu vaccine changes each year, so being vaccinated once is not enough.  Pneumococcal polysaccharide. You need 1 to 2 doses if you smoke cigarettes  or if you have certain chronic medical conditions. You need 1 dose at age 7 (or older) if you have never been vaccinated.  Tetanus, diphtheria, pertussis (Tdap, Td). Get 1 dose of Tdap vaccine if you are younger than age 98, are over 21 and have contact with an infant, are a Research scientist (physical sciences), are pregnant, or simply want to be protected from whooping cough. After that, you need a Td booster dose every 10 years. Consult your caregiver if you have not had at least 3 tetanus and diphtheria-containing shots sometime in your life or have a deep or dirty wound.  HPV. You need this vaccine if you are a woman age 85 or younger. The vaccine is given in 3 doses over 6 months.  Measles, mumps, rubella (MMR). You need at least 1 dose of MMR if you were born in 1957 or later. You may also need a second dose.  Meningococcal. If you are age 68 to 12 and a first-year college student living in a residence hall, or have one of several medical conditions, you need to get vaccinated against meningococcal disease. You may also need additional booster doses.  Zoster (shingles). If you are age 78 or older, you should get this vaccine.  Varicella (chickenpox). If you have never had chickenpox or you were vaccinated but received only 1 dose, talk to your caregiver to find out if you need this vaccine.  Hepatitis A. You need this vaccine if you have a specific risk factor for hepatitis A virus infection or you simply wish to be  protected from this disease. The vaccine is usually given as 2 doses, 6 to 18 months apart.  Hepatitis B. You need this vaccine if you have a specific risk factor for hepatitis B virus infection or you simply wish to be protected from this disease. The vaccine is given in 3 doses, usually over 6 months. Preventive Services / Frequency Ages 79 to 70  Blood pressure check.** / Every 1 to 2 years.  Lipid and cholesterol check.** / Every 5 years beginning at age 64.  Clinical breast exam.** / Every 3 years for women in their 34s and 30s.  Pap test.** / Every 2 years from ages 25 through 41. Every 3 years starting at age 28 through age 59 or 13 with a history of 3 consecutive normal Pap tests.  HPV screening.** / Every 3 years from ages 22 through ages 82 to 53 with a history of 3 consecutive normal Pap tests.  Hepatitis C blood test.** / For any individual with known risks for hepatitis C.  Skin self-exam. / Monthly.  Influenza immunization.** / Every year.  Pneumococcal polysaccharide immunization.** / 1 to 2 doses if you smoke cigarettes or if you have certain chronic medical conditions.  Tetanus, diphtheria, pertussis (Tdap, Td) immunization. / A one-time dose of Tdap vaccine. After that, you need a Td booster dose every 10 years.  HPV immunization. / 3 doses over 6 months, if you are 46 and younger.  Measles, mumps, rubella (MMR) immunization. / You need at least 1 dose of MMR if you were born in 1957 or later. You may also need a second dose.  Meningococcal immunization. / 1 dose if you are age 8 to 34 and a first-year college student living in a residence hall, or have one of several medical conditions, you need to get vaccinated against meningococcal disease. You may also need additional booster doses.  Varicella immunization.** / Consult your caregiver.  Hepatitis A  immunization.** / Consult your caregiver. 2 doses, 6 to 18 months apart.  Hepatitis B immunization.** / Consult  your caregiver. 3 doses usually over 6 months. Ages 43 to 39  Blood pressure check.** / Every 1 to 2 years.  Lipid and cholesterol check.** / Every 5 years beginning at age 25.  Clinical breast exam.** / Every year after age 25.  Mammogram.** / Every year beginning at age 16 and continuing for as long as you are in good health. Consult with your caregiver.  Pap test.** / Every 3 years starting at age 67 through age 71 or 13 with a history of 3 consecutive normal Pap tests.  HPV screening.** / Every 3 years from ages 33 through ages 76 to 63 with a history of 3 consecutive normal Pap tests.  Fecal occult blood test (FOBT) of stool. / Every year beginning at age 1 and continuing until age 73. You may not need to do this test if you get a colonoscopy every 10 years.  Flexible sigmoidoscopy or colonoscopy.** / Every 5 years for a flexible sigmoidoscopy or every 10 years for a colonoscopy beginning at age 28 and continuing until age 5.  Hepatitis C blood test.** / For all people born from [birth year] through 1963 and any individual with known risks for hepatitis C.  Skin self-exam. / Monthly.  Influenza immunization.** / Every year.  Pneumococcal polysaccharide immunization.** / 1 to 2 doses if you smoke cigarettes or if you have certain chronic medical conditions.  Tetanus, diphtheria, pertussis (Tdap, Td) immunization.** / A one-time dose of Tdap vaccine. After that, you need a Td booster dose every 10 years.  Measles, mumps, rubella (MMR) immunization. / You need at least 1 dose of MMR if you were born in 1957 or later. You may also need a second dose.  Varicella immunization.** / Consult your caregiver.  Meningococcal immunization.** / Consult your caregiver.  Hepatitis A immunization.** / Consult your caregiver. 2 doses, 6 to 18 months apart.  Hepatitis B immunization.** / Consult your caregiver. 3 doses, usually over 6 months. Ages 37 and over  Blood pressure check.** / Every 1 to  2 years.  Lipid and cholesterol check.** / Every 5 years beginning at age 31.  Clinical breast exam.** / Every year after age 10.  Mammogram.** / Every year beginning at age 75 and continuing for as long as you are in good health. Consult with your caregiver.  Pap test.** / Every 3 years starting at age 65 through age 79 or 75 with a 3 consecutive normal Pap tests. Testing can be stopped between 65 and 70 with 3 consecutive normal Pap tests and no abnormal Pap or HPV tests in the past 10 years.  HPV screening.** / Every 3 years from ages 68 through ages 39 or 68 with a history of 3 consecutive normal Pap tests. Testing can be stopped between 65 and 70 with 3 consecutive normal Pap tests and no abnormal Pap or HPV tests in the past 10 years.  Fecal occult blood test (FOBT) of stool. / Every year beginning at age 57 and continuing until age 27. You may not need to do this test if you get a colonoscopy every 10 years.  Flexible sigmoidoscopy or colonoscopy.** / Every 5 years for a flexible sigmoidoscopy or every 10 years for a colonoscopy beginning at age 48 and continuing until age 67.  Hepatitis C blood test.** / For all people born from [birth year] through 1963 and any individual with known  risks for hepatitis C.  Osteoporosis screening.** / A one-time screening for women ages 24 and over and women at risk for fractures or osteoporosis.  Skin self-exam. / Monthly.  Influenza immunization.** / Every year.  Pneumococcal polysaccharide immunization.** / 1 dose at age 46 (or older) if you have never been vaccinated.  Tetanus, diphtheria, pertussis (Tdap, Td) immunization. / A one-time dose of Tdap vaccine if you are over 65 and have contact with an infant, are a Research scientist (physical sciences), or simply want to be protected from whooping cough. After that, you need a Td booster dose every 10 years.  Varicella immunization.** / Consult your caregiver.  Meningococcal immunization.** / Consult your  caregiver.  Hepatitis A immunization.** / Consult your caregiver. 2 doses, 6 to 18 months apart.  Hepatitis B immunization.** / Check with your caregiver. 3 doses, usually over 6 months. ** Family history and personal history of risk and conditions may change your caregiver's recommendations. Document Released: 03/17/2001 Document Revised: 04/13/2011 Document Reviewed: 06/16/2010 [facility] Patient Information 2013 [place], [place].   DASH Diet The DASH diet stands for "Dietary Approaches to Stop Hypertension." It is a healthy eating plan that has been shown to reduce high blood pressure (hypertension) in as little as 14 days, while also possibly providing other significant health benefits. These other health benefits include reducing the risk of breast cancer after menopause and reducing the risk of type 2 diabetes, heart disease, colon cancer, and stroke. Health benefits also include weight loss and slowing kidney failure in patients with chronic kidney disease.  DIET GUIDELINES  Limit salt (sodium). Your diet should contain less than 1500 mg of sodium daily.  Limit refined or processed carbohydrates. Your diet should include mostly whole grains. Desserts and added sugars should be used sparingly.  Include small amounts of heart-healthy fats. These types of fats include nuts, oils, and tub margarine. Limit saturated and trans fats. These fats have been shown to be harmful in the body. CHOOSING FOODS  The following food groups are based on a 2000 calorie diet. See your Registered Dietitian for individual calorie needs. Grains and Grain Products (6 to 8 servings daily)  Eat More Often: Whole-wheat bread, brown rice, whole-grain or wheat pasta, quinoa, popcorn without added fat or salt (air popped).  Eat Less Often: White bread, white pasta, white rice, cornbread. Vegetables (4 to 5 servings daily)  Eat More Often: Fresh, frozen, and canned vegetables. Vegetables may be raw, steamed, roasted,  or grilled with a minimal amount of fat.  Eat Less Often/Avoid: Creamed or fried vegetables. Vegetables in a cheese sauce. Fruit (4 to 5 servings daily)  Eat More Often: All fresh, canned (in natural juice), or frozen fruits. Dried fruits without added sugar. One hundred percent fruit juice ( cup [237 mL] daily).  Eat Less Often: Dried fruits with added sugar. Canned fruit in light or heavy syrup. [employer], Fish, and Poultry (2 servings or less daily. One serving is 3 to 4 oz [85-114 g]).  Eat More Often: Ninety percent or leaner ground beef, tenderloin, sirloin. Round cuts of beef, chicken breast, [place] breast. All fish. Grill, bake, or broil your meat. Nothing should be fried.  Eat Less Often/Avoid: Fatty cuts of meat, [place], or chicken leg, thigh, or wing. Fried cuts of meat or fish. Dairy (2 to 3 servings)  Eat More Often: Low-fat or fat-free milk, low-fat plain or light yogurt, reduced-fat or part-skim cheese.  Eat Less Often/Avoid: Milk (whole, 2%).Whole milk yogurt. Full-fat cheeses. Nuts, Seeds,  and Legumes (4 to 5 servings per week)  Eat More Often: All without added salt.  Eat Less Often/Avoid: Salted nuts and seeds, canned beans with added salt. Fats and Sweets (limited)  Eat More Often: Vegetable oils, tub margarines without trans fats, sugar-free gelatin. Mayonnaise and salad dressings.  Eat Less Often/Avoid: Coconut oils, palm oils, butter, stick margarine, cream, half and half, cookies, candy, pie. FOR MORE INFORMATION The Dash Diet Eating Plan: www.dashdiet.org Document Released: 01/08/2011 Document Revised: 04/13/2011 Document Reviewed: 01/08/2011 [facility] Patient Information 2013 [place], [place].

## 2012-05-17 ENCOUNTER — Encounter: Payer: Self-pay | Admitting: Internal Medicine

## 2012-05-22 DIAGNOSIS — K219 Gastro-esophageal reflux disease without esophagitis: Secondary | ICD-10-CM | POA: Insufficient documentation

## 2012-05-30 ENCOUNTER — Other Ambulatory Visit: Payer: Self-pay | Admitting: *Deleted

## 2012-05-30 MED ORDER — LEVOTHYROXINE SODIUM 100 MCG PO TABS: 100.0000 ug | ORAL_TABLET | Freq: Every day | ORAL | Status: DC

## 2012-06-03 ENCOUNTER — Other Ambulatory Visit: Payer: Self-pay | Admitting: Family Medicine

## 2012-06-03 MED ORDER — ESOMEPRAZOLE MAGNESIUM 20 MG PO CPDR: 20.0000 mg | DELAYED_RELEASE_CAPSULE | Freq: Every day | ORAL | Status: DC

## 2012-09-04 IMAGING — CR DG FOOT COMPLETE 3+V*L*
3 series · 3 of 3 positions shown · non-contrast
Comparison: None.

CLINICAL DATA: Foot pain and swelling.  No known injury.

LEFT FOOT - COMPLETE 3+ VIEW

[view not recorded (1 of 3)]
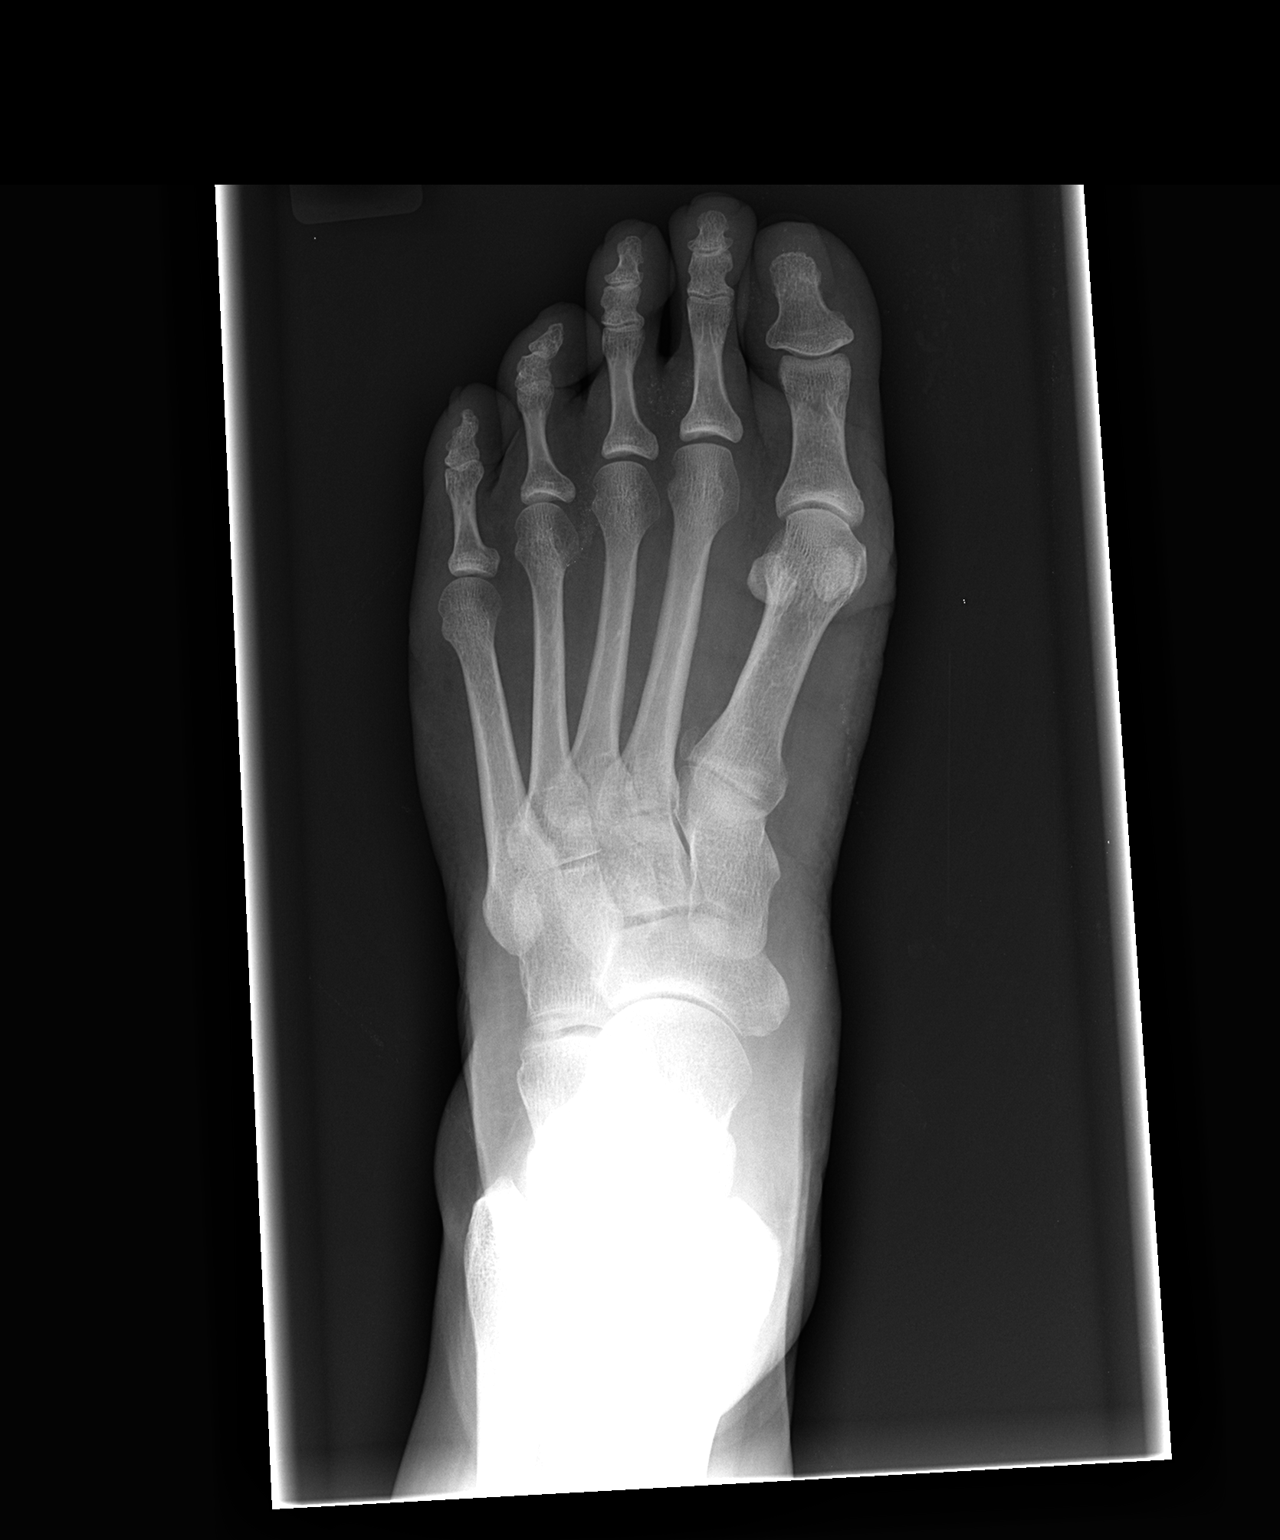

[view not recorded (2 of 3)]
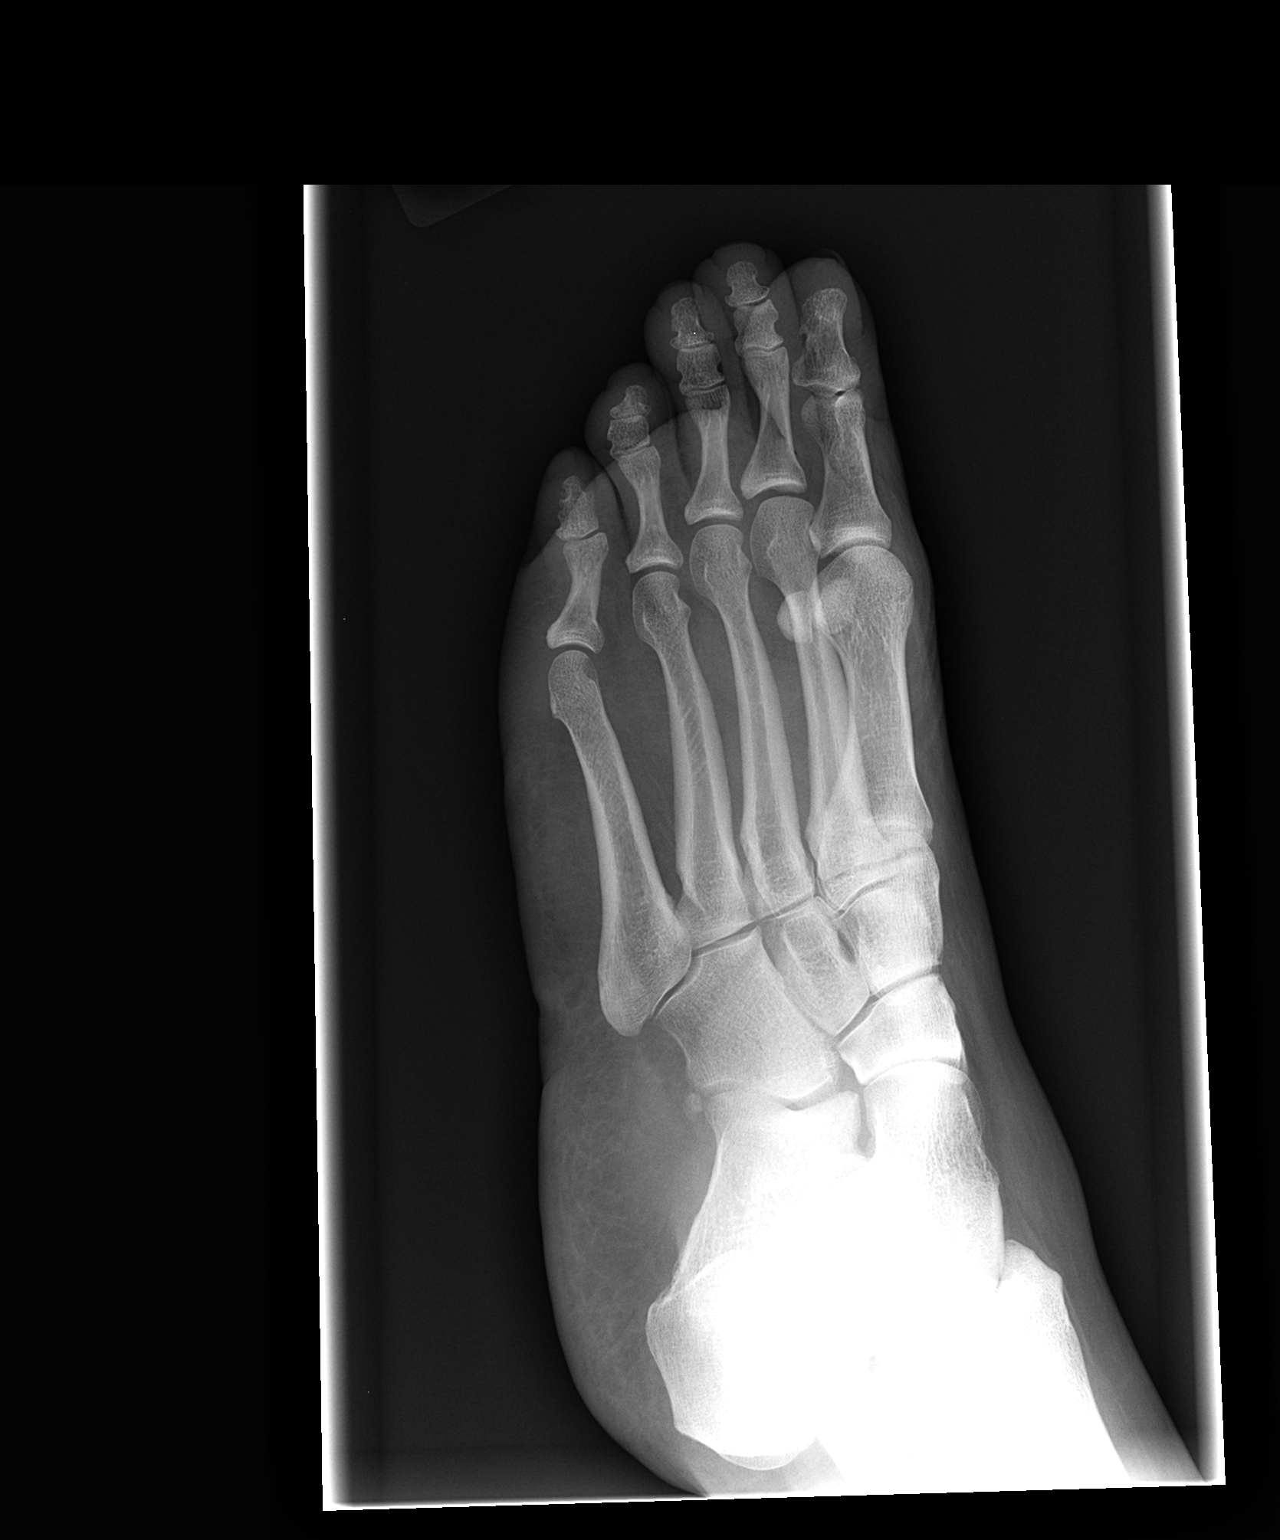

[view not recorded (3 of 3)]
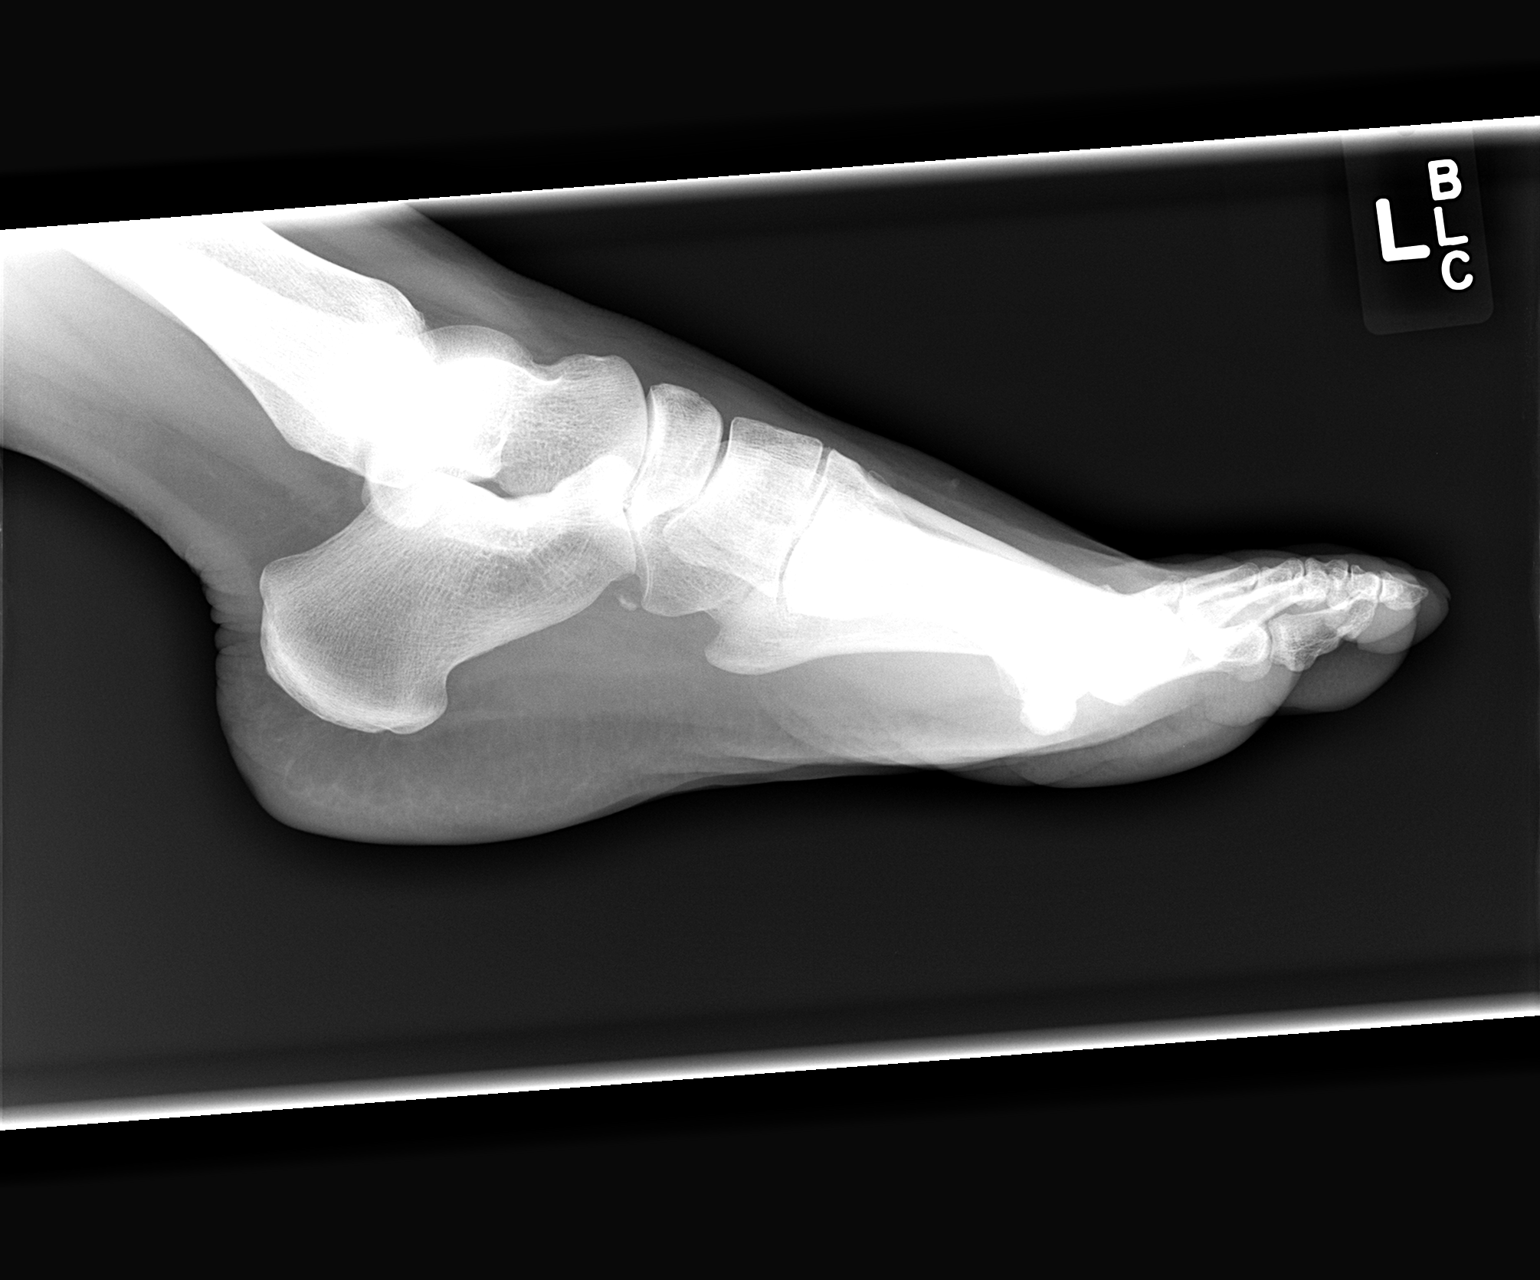

[3 of 3 positions shown; findings below may reference images not displayed]

FINDINGS: Imaged bones, joints and soft tissues appear normal.
IMPRESSION: Negative exam.

## 2012-09-19 ENCOUNTER — Ambulatory Visit (INDEPENDENT_AMBULATORY_CARE_PROVIDER_SITE_OTHER): Admitting: Internal Medicine

## 2012-09-19 ENCOUNTER — Encounter: Payer: Self-pay | Admitting: Internal Medicine

## 2012-09-19 VITALS — BP 138/90 | HR 68 | Temp 98.5°F | Wt 197.0 lb

## 2012-09-19 DIAGNOSIS — E890 Postprocedural hypothyroidism: Secondary | ICD-10-CM

## 2012-09-19 DIAGNOSIS — N924 Excessive bleeding in the premenopausal period: Secondary | ICD-10-CM

## 2012-09-19 DIAGNOSIS — R635 Abnormal weight gain: Secondary | ICD-10-CM | POA: Insufficient documentation

## 2012-09-19 DIAGNOSIS — N946 Dysmenorrhea, unspecified: Secondary | ICD-10-CM

## 2012-09-19 LAB — FOLLICLE STIMULATING HORMONE: FSH: 94.5 m[IU]/mL

## 2012-09-19 LAB — CBC WITH DIFFERENTIAL/PLATELET
Basophils Absolute: 0 10*3/uL (ref 0.0–0.1)
Eosinophils Absolute: 0.4 10*3/uL (ref 0.0–0.7)
Lymphocytes Relative: 28.3 % (ref 12.0–46.0)
MCHC: 32.2 g/dL (ref 30.0–36.0)
MCV: 80.3 fl (ref 78.0–100.0)
Monocytes Absolute: 0.5 10*3/uL (ref 0.1–1.0)
Neutrophils Relative %: 57.1 % (ref 43.0–77.0)
Platelets: 306 10*3/uL (ref 150.0–400.0)
RBC: 4.67 Mil/uL (ref 3.87–5.11)
RDW: 16.6 % — ABNORMAL HIGH (ref 11.5–14.6)

## 2012-09-19 NOTE — Progress Notes (Signed)
Chief Complaint  Patient presents with  . Heavy/Painful menstrual bleeding    Did not menustrate for 15 months.  Started on Aug. 4.  Pt said it was very painful with heavy bleeding.    Patient comes in today for  new problem evaluation.  Recurrence of menses that  Was absent for over a year as above.  Recounting of last periods June 20 12  Cramps and heavy  Then jan 2013 normal Then march 20 13 . Lasted 3-4  Days  Then no bleeding until July 2014 .  About  4-5  Not that painful August 2014 4 th very painful. 7 days.   No major change in her health except she has gained weight no change in thyroid medication and good adherence generic medicine  Last TSH 4 months ago good  Nights now  Rotating   q 2 weeks for 6 week cycles  Some sleep deprivation   weight gain   ROS: See pertinent positives and negatives per HPI. No excess bleeding elsewhere.  Past Medical History  Diagnosis Date  . Allergy   . GERD (gastroesophageal reflux disease)   . Varicella     as a child  . Vertigo, intermittent     positional neg ct scan  . History of abnormal Pap smear     atypical ASCUS neg HPV neg   . Hyperthyroidism     Family History  Problem Relation Age of Onset  . Hypertension Mother   . Stroke Sister   . Hypertension Sister   . Other Sister     moyamoya  . Crohn's disease Daughter   . Leukemia Sister   . Graves' disease Daughter      brother and husband     History   Social History  . Marital Status: Married    Spouse Name: N/A    Number of Children: N/A  . Years of Education: N/A   Social History Main Topics  . Smoking status: Never Smoker   . Smokeless tobacco: None  . Alcohol Use: No  . Drug Use: No  . Sexual Activity:    Other Topics Concern  . 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    Changing to [place] rotating shifts   fuull time end of April    Married   HH of 2  Husband  works [employer]     Gaffer back from [place] 2009   Non  Smoker   Husband had vascectomy    Outpatient Encounter Prescriptions as of 09/19/2012  Medication Sig Dispense Refill  . esomeprazole (NEXIUM) 20 MG capsule Take 1 capsule (20 mg total) by mouth daily before breakfast.  90 capsule  2  . levothyroxine (SYNTHROID) 100 MCG tablet Take 1 tablet (100 mcg total) by mouth daily.  90 tablet  2   No facility-administered encounter medications on file as of 09/19/2012.    EXAM:  BP 138/90  Pulse 68  Temp(Src) 98.5 F (36.9 C) (Oral)  Wt 197 lb (89.359 kg)  BMI 35.17 kg/m2  SpO2 97%  LMP 09/05/2012  Body mass index is 35.17 kg/(m^2). Wt Readings from Last 3 Encounters:  09/19/12 197 lb (89.359 kg)  05/16/12 192 lb (87.091 kg)  01/11/12 190 lb (86.183 kg)    GENERAL: vitals reviewed and listed above, alert, oriented, appears well hydrated and in no acute distress looks well but  tired  HEENT: atraumatic, conjunctiva  clear, no obvious abnormalities on inspection of external nose and ears  NECK: no obvious masses on inspection palpation  Abdomen:  Sof,t normal bowel sounds without hepatosplenomegaly, no guarding rebound  no CVA tenderness CV: HRRR, no clubbing cyanosis  nl cap refill  MS: moves all extremities without noticeable focal  abnormality PSYCH: pleasant and cooperative, no obvious depression or anxiety  ASSESSMENT AND PLAN:  Discussed the following assessment and plan:  Dysmenorrhea - Plan: TSH, CBC with Differential, Follicle Stimulating Hormone, Ambulatory referral to Gynecology  Menopausal bleeding - Plan: TSH, CBC with Differential, Follicle Stimulating Hormone, Ambulatory referral to Gynecology  Other postablative hypothyroidism May still be perimenopausal and thyroid weight gain  Rotating shift work contributing  .  2 weight gain. Needs GYN evaluation check TSH function/ rule out anemia /FSH level -Patient advised to return or notify health care team  if symptoms  worsen or persist or new concerns arise.  Patient Instructions  Will notify you  of labs when available. Sign up   Of My Chart.  This may just be    Periods  Starting for a whil longer but   Because it has been more than a year and the pain   Will   Do a gyne referral.    Deborah Mcdowell. Joslin Doell M.D.

## 2012-09-19 NOTE — Patient Instructions (Signed)
Will notify you  of labs when available. Sign up   Of My Chart.  This may just be    Periods  Starting for a whil longer but   Because it has been more than a year and the pain   Will   Do a gyne referral.

## 2013-02-06 ENCOUNTER — Other Ambulatory Visit: Payer: Self-pay | Admitting: Endocrinology

## 2013-04-19 ENCOUNTER — Other Ambulatory Visit: Payer: Self-pay | Admitting: Endocrinology

## 2013-04-19 NOTE — Telephone Encounter (Signed)
Please advise if ok to refill. Thanks 

## 2013-04-19 NOTE — Telephone Encounter (Signed)
Done

## 2013-04-19 NOTE — Telephone Encounter (Signed)
Please refill x 1 Ov is due  

## 2013-05-15 ENCOUNTER — Other Ambulatory Visit: Payer: Self-pay | Admitting: Internal Medicine

## 2013-05-15 ENCOUNTER — Other Ambulatory Visit (INDEPENDENT_AMBULATORY_CARE_PROVIDER_SITE_OTHER)

## 2013-05-15 DIAGNOSIS — Z0000 Encounter for general adult medical examination without abnormal findings: Secondary | ICD-10-CM

## 2013-05-15 DIAGNOSIS — E890 Postprocedural hypothyroidism: Secondary | ICD-10-CM

## 2013-05-15 LAB — HEPATIC FUNCTION PANEL
ALBUMIN: 4 g/dL (ref 3.5–5.2)
ALK PHOS: 68 U/L (ref 39–117)
ALT: 18 U/L (ref 0–35)
AST: 23 U/L (ref 0–37)
Bilirubin, Direct: 0 mg/dL (ref 0.0–0.3)
Total Bilirubin: 0.5 mg/dL (ref 0.3–1.2)
Total Protein: 7.7 g/dL (ref 6.0–8.3)

## 2013-05-15 LAB — CBC WITH DIFFERENTIAL/PLATELET
BASOS ABS: 0 10*3/uL (ref 0.0–0.1)
Basophils Relative: 0.6 % (ref 0.0–3.0)
EOS ABS: 0.5 10*3/uL (ref 0.0–0.7)
Eosinophils Relative: 6.8 % — ABNORMAL HIGH (ref 0.0–5.0)
HCT: 41.3 % (ref 36.0–46.0)
HEMOGLOBIN: 13.1 g/dL (ref 12.0–15.0)
LYMPHS PCT: 36.8 % (ref 12.0–46.0)
Lymphs Abs: 2.9 10*3/uL (ref 0.7–4.0)
MCHC: 31.8 g/dL (ref 30.0–36.0)
MCV: 80.1 fl (ref 78.0–100.0)
Monocytes Absolute: 0.6 10*3/uL (ref 0.1–1.0)
Monocytes Relative: 7.9 % (ref 3.0–12.0)
NEUTROS ABS: 3.8 10*3/uL (ref 1.4–7.7)
NEUTROS PCT: 47.9 % (ref 43.0–77.0)
PLATELETS: 322 10*3/uL (ref 150.0–400.0)
RBC: 5.16 Mil/uL — ABNORMAL HIGH (ref 3.87–5.11)
RDW: 16.5 % — AB (ref 11.5–14.6)
WBC: 7.9 10*3/uL (ref 4.5–10.5)

## 2013-05-15 LAB — LIPID PANEL
CHOLESTEROL: 196 mg/dL (ref 0–200)
HDL: 41.6 mg/dL (ref >39.00)
LDL Cholesterol: 142 mg/dL — ABNORMAL HIGH (ref 0–99)
Total CHOL/HDL Ratio: 5
Triglycerides: 64 mg/dL (ref 0.0–149.0)
VLDL: 12.8 mg/dL (ref 0.0–40.0)

## 2013-05-15 LAB — BASIC METABOLIC PANEL
BUN: 17 mg/dL (ref 6–23)
CALCIUM: 8.7 mg/dL (ref 8.4–10.5)
CO2: 30 meq/L (ref 19–32)
CREATININE: 1 mg/dL (ref 0.4–1.2)
Chloride: 105 mEq/L (ref 96–112)
GFR: 77.84 mL/min (ref >60.00)
GLUCOSE: 85 mg/dL (ref 70–99)
Potassium: 3.7 mEq/L (ref 3.5–5.1)
Sodium: 141 mEq/L (ref 135–145)

## 2013-05-15 LAB — TSH: TSH: 3.5 u[IU]/mL (ref 0.35–5.50)

## 2013-05-22 ENCOUNTER — Encounter: Payer: Self-pay | Admitting: Internal Medicine

## 2013-05-22 ENCOUNTER — Ambulatory Visit (INDEPENDENT_AMBULATORY_CARE_PROVIDER_SITE_OTHER): Admitting: Internal Medicine

## 2013-05-22 VITALS — BP 150/84 | HR 65 | Temp 97.8°F | Ht 62.5 in | Wt 203.0 lb

## 2013-05-22 DIAGNOSIS — R5383 Other fatigue: Secondary | ICD-10-CM | POA: Insufficient documentation

## 2013-05-22 DIAGNOSIS — Z0000 Encounter for general adult medical examination without abnormal findings: Secondary | ICD-10-CM

## 2013-05-22 DIAGNOSIS — R635 Abnormal weight gain: Secondary | ICD-10-CM

## 2013-05-22 DIAGNOSIS — E890 Postprocedural hypothyroidism: Secondary | ICD-10-CM

## 2013-05-22 DIAGNOSIS — G4726 Circadian rhythm sleep disorder, shift work type: Secondary | ICD-10-CM | POA: Insufficient documentation

## 2013-05-22 DIAGNOSIS — R030 Elevated blood-pressure reading, without diagnosis of hypertension: Secondary | ICD-10-CM

## 2013-05-22 DIAGNOSIS — R5381 Other malaise: Secondary | ICD-10-CM

## 2013-05-22 MED ORDER — RAMELTEON 8 MG PO TABS: 8.0000 mg | ORAL_TABLET | Freq: Every day | ORAL | Status: DC

## 2013-05-22 MED ORDER — RAMELTEON 8 MG PO TABS
8.0000 mg | ORAL_TABLET | Freq: Every day | ORAL | Status: DC
Start: 2013-05-22 — End: 2013-08-21

## 2013-05-22 NOTE — Progress Notes (Signed)
Chief Complaint  Patient presents with  . Annual Exam    tired   . Hypothyroidism    HPI: Patient comes in today for Preventive Health Care visit  No major change in health status since last visit .now doing nioght 7 - 7 3 night per week [place] no days surgical   THYROID taking meds  Not seeing ellison.   BP  Usually not  checked .   Has gained weight in this job.    Tired all the time   Goes to bed in am but cant stay asleep and wakens at 11- 12 am getting her less than 5 hours of sleep the on off days +sleeps all the time"still not rested .  Denies snoring OSA sx.   Only ocass periods  Last on 4 months ago .   Due for mammogram  No meds neg tad   Health Maintenance  Topic Date Due  . Mammogram  04/19/2012  . Colonoscopy  04/19/2012  . Influenza Vaccine  09/02/2013  . Pap Smear  05/14/2014  . Tetanus/tdap  02/02/2017   Health Maintenance Review   ROS:  GEN/ HEENT: No fever, significant weight changes sweats headaches vision problems hearing changes, CV/ PULM; No chest pain shortness of breath cough, syncope,edema  change in exercise tolerance. GI /GU: No adominal pain, vomiting, change in bowel habits. No blood in the stool. No significant GU symptoms. SKIN/HEME: ,no acute skin rashes suspicious lesions or bleeding. No lymphadenopathy, nodules, masses.  NEURO/ PSYCH:  No neurologic signs such as weakness numbness. No depression anxiety. IMM/ Allergy: No unusual infections.  Allergy .   REST of 12 system review negative except as per HPI   Past Medical History  Diagnosis Date  . Allergy   . GERD (gastroesophageal reflux disease)   . Varicella     as a child  . Vertigo, intermittent     positional neg ct scan  . History of abnormal Pap smear     atypical ASCUS neg HPV neg   . Hyperthyroidism     Family History  Problem Relation Age of Onset  . Hypertension Mother   . Stroke Sister   . Hypertension Sister   . Other Sister     moyamoya  . Crohn's  disease Daughter   . Leukemia Sister   . Graves' disease Daughter      brother and husband     History   Social History  . Marital Status: Married    Spouse Name: N/A    Number of Children: N/A  . Years of Education: N/A   Social History Main Topics  . Smoking status: Never Smoker   . Smokeless tobacco: None  . Alcohol Use: No  . Drug Use: No  . Sexual Activity:    Other Topics Concern  . 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    Now roatating day night shifts    Changing to  rotating shifts   fuull time end of April    Married   HH of 2  Husband works IT sales professional back from [place] 2009   Non  Smoker   Husband had vascectomy    Outpatient Encounter Prescriptions as of 05/22/2013  Medication Sig  . levothyroxine (SYNTHROID, LEVOTHROID) 100 MCG tablet TAKE 1 TABLET DAILY (PATIENT NEEDS TO SCHEDULE APPOINTMENT WITH DR. Everardo All FOR  FURTHER REFILLS)  . NEXIUM 20 MG capsule TAKE 1 CAPSULE DAILY BEFORE BREAKFAST  . ramelteon (ROZEREM) 8 MG tablet Take 1 tablet (8 mg total) by mouth at bedtime. As directed  . [DISCONTINUED] ramelteon (ROZEREM) 8 MG tablet Take 1 tablet (8 mg total) by mouth at bedtime. As directed    EXAM:  BP 150/84  Pulse 65  Temp(Src) 97.8 F (36.6 C) (Oral)  Ht 5' 2.5" (1.588 m)  Wt 203 lb (92.08 kg)  BMI 36.51 kg/m2  SpO2 98%  Body mass index is 36.51 kg/(m^2).  Physical Exam: Vital signs reviewed ZOX:WRUEGEN:This is a well-developed well-nourished alert cooperative    who appearsr stated age in no acute distress. Looks sleepy tired  HEENT: normocephalic atraumatic , Eyes: PERRL EOM's full, conjunctiva clear, Nares: paten,t no deformity discharge or tenderness., Ears: no deformity EAC's clear TMs with normal landmarks. Mouth: clear OP, no lesions, edema.  Moist mucous membranes. Dentition in adequate repair. NECK: supple without masses, thyromegaly or  bruits. CHEST/PULM:  Clear to auscultation and percussion breath sounds equal no wheeze , rales or rhonchi. No chest wall deformities or tenderness. CV: PMI is nondisplaced, S1 S2 no gallops, murmurs, rubs. Peripheral pulses are full without delay.No JVD .  ABDOMEN: Bowel sounds normal nontender  No guard or rebound, no hepato splenomegal no CVA tenderness.  No hernia. Extremtities:  No clubbing cyanosis or edema, no acute joint swelling or redness no focal atrophy NEURO:  Oriented x3, cranial nerves 3-12 appear to be intact, no obvious focal weakness,gait within normal limits no abnormal reflexes or asymmetrical SKIN: No acute rashes normal turgor, color, no bruising or petechiae. PSYCH: Oriented, good eye contact, no obvious depression anxiety, cognition and judgment appear normal. Diffuse like someone tired  LN: no cervical axillary inguinal adenopathy  Lab Results  Component Value Date   WBC 7.9 05/15/2013   HGB 13.1 05/15/2013   HCT 41.3 05/15/2013   PLT 322.0 05/15/2013   GLUCOSE 85 05/15/2013   CHOL 196 05/15/2013   TRIG 64.0 05/15/2013   HDL 41.60 05/15/2013   LDLDIRECT 156.6 05/09/2012   LDLCALC 142* 05/15/2013   ALT 18 05/15/2013   AST 23 05/15/2013   NA 141 05/15/2013   K 3.7 05/15/2013   CL 105 05/15/2013   CREATININE 1.0 05/15/2013   BUN 17 05/15/2013   CO2 30 05/15/2013   TSH 3.50 05/15/2013   BP Readings from Last 3 Encounters:  05/22/13 150/84  09/19/12 138/90  05/16/12 128/90   Wt Readings from Last 3 Encounters:  05/22/13 203 lb (92.08 kg)  09/19/12 197 lb (89.359 kg)  05/16/12 192 lb (87.091 kg)    ASSESSMENT AND PLAN:  Discussed the following assessment and plan:  Visit for preventive health examination  Other postablative hypothyroidism - in range but rechek in 3 month 3 range  Sleep disorder, shift work - pob maintaining sleep   Elevated blood pressure reading - check reasdings lsi  consdier rx if elevated at 3 month check   Weight gain  Fatigue - ? sleep  related ? at this time declines sleep consult.  Patient Care Team: Madelin HeadingsWanda K Panosh, MD as PCP - General Romero BellingSean Ellison, MD as Attending Physician (Internal Medicine) Genia DelMarie-Lyne Lavoie, MD as Consulting Physician (Obstetrics and Gynecology) Patient Instructions  Recheck tsh  In about 3 months to ensures stability . I think you have sleep deprivation sx with the night shift and lack of day  Sleep.  This is adding to  fatigue and ability  to get to a healthy weight . Contact us if you are willing to see sleep specialist (? meds may help yyou get on track) In ths short run  Try sleep aid after work to see if sleep longer ( dark room no noises etc) Take medication  Before sleep on days after shift . Check BP readings  At least  3 x per week  If not at goal  after  lifestyle intervention healthy eating and exercise . We may add medication.     DASH Diet The DASH diet stands for "Dietary Approaches to Stop Hypertension." It is a healthy eating plan that has been shown to reduce high blood pressure (hypertension) in as little as 14 days, while also possibly providing other significant health benefits. These other health benefits include reducing the risk of breast cancer after menopause and reducing the risk of type 2 diabetes, heart disease, colon cancer, and stroke. Health benefits also include weight loss and slowing kidney failure in patients with chronic kidney disease.  DIET GUIDELINES  Limit salt (sodium). Your diet should contain less than 1500 mg of sodium daily.  Limit refined or processed carbohydrates. Your diet should include mostly whole grains. Desserts and added sugars should be used sparingly.  Include small amounts of heart-healthy fats. These types of fats include nuts, oils, and tub margarine. Limit saturated and trans fats. These fats have been shown to be harmful in the body. CHOOSING FOODS  The following food groups are based on a 2000 calorie diet. See your Registered Dietitian  for individual calorie needs. Grains and Grain Products (6 to 8 servings daily)  Eat More Often: Whole-wheat bread, brown rice, whole-grain or wheat pasta, quinoa, popcorn without added fat or salt (air popped).  Eat Less Often: White bread, white pasta, white rice, cornbread. Vegetables (4 to 5 servings daily)  Eat More Often: Fresh, frozen, and canned vegetables. Vegetables may be raw, steamed, roasted, or grilled with a minimal amount of fat.  Eat Less Often/Avoid: Creamed or fried vegetables. Vegetables in a cheese sauce. Fruit (4 to 5 servings daily)  Eat More Often: All fresh, canned (in natural juice), or frozen fruits. Dried fruits without added sugar. One hundred percent fruit juice ( cup [237 mL] daily).  Eat Less Often: Dried fruits with added sugar. Canned fruit in light or heavy syrup. [employer], Fish, and Poultry (2 servings or less daily. One serving is 3 to 4 oz [85-114 g]).  Eat More Often: Ninety percent or leaner ground beef, tenderloin, sirloin. Round cuts of beef, chicken breast, [place] breast. All fish. Grill, bake, or broil your meat. Nothing should be fried.  Eat Less Often/Avoid: Fatty cuts of meat, [place], or chicken leg, thigh, or wing. Fried cuts of meat or fish. Dairy (2 to 3 servings)  Eat More Often: Low-fat or fat-free milk, low-fat plain or light yogurt, reduced-fat or part-skim cheese.  Eat Less Often/Avoid: Milk (whole, 2%).Whole milk yogurt. Full-fat cheeses. Nuts, Seeds, and Legumes (4 to 5 servings per week)  Eat More Often: All without added salt.  Eat Less Often/Avoid: Salted nuts and seeds, canned beans with added salt. Fats and Sweets (limited)  Eat More Often: Vegetable oils, tub margarines without trans fats, sugar-free gelatin. Mayonnaise and salad dressings.  Eat Less Often/Avoid: Coconut oils, palm oils, butter, stick margarine, cream, half and half, cookies, candy, pie. FOR MORE INFORMATION The Dash Diet Eating Plan:  www.dashdiet.org Document Released: 01/08/2011 Document Revised: 04/13/2011 Document Reviewed: 01/08/2011 ExitCare Patient Information  2014 [employer], [place].     Neta MendsWanda K. Panosh M.D.   Pre visit review using our clinic review tool, if applicable. No additional management support is needed unless otherwise documented below in the visit note.

## 2013-05-22 NOTE — Patient Instructions (Signed)
Recheck tsh  In about 3 months to ensures stability . I think you have sleep deprivation sx with the night shift and lack of day  Sleep.  This is adding to  fatigue and ability to get to a healthy weight . Contact us if you are willing to see sleep specialist (? meds may help yyou get on track) In ths short run  Try sleep aid after work to see if sleep longer ( dark room no noises etc) Take medication  Before sleep on days after shift . Check BP readings  At least  3 x per week  If not at goal  after  lifestyle intervention healthy eating and exercise . We may add medication.     DASH Diet The DASH diet stands for "Dietary Approaches to Stop Hypertension." It is a healthy eating plan that has been shown to reduce high blood pressure (hypertension) in as little as 14 days, while also possibly providing other significant health benefits. These other health benefits include reducing the risk of breast cancer after menopause and reducing the risk of type 2 diabetes, heart disease, colon cancer, and stroke. Health benefits also include weight loss and slowing kidney failure in patients with chronic kidney disease.  DIET GUIDELINES  Limit salt (sodium). Your diet should contain less than 1500 mg of sodium daily.  Limit refined or processed carbohydrates. Your diet should include mostly whole grains. Desserts and added sugars should be used sparingly.  Include small amounts of heart-healthy fats. These types of fats include nuts, oils, and tub margarine. Limit saturated and trans fats. These fats have been shown to be harmful in the body. CHOOSING FOODS  The following food groups are based on a 2000 calorie diet. See your Registered Dietitian for individual calorie needs. Grains and Grain Products (6 to 8 servings daily)  Eat More Often: Whole-wheat bread, brown rice, whole-grain or wheat pasta, quinoa, popcorn without added fat or salt (air popped).  Eat Less Often: White bread, white pasta, white  rice, cornbread. Vegetables (4 to 5 servings daily)  Eat More Often: Fresh, frozen, and canned vegetables. Vegetables may be raw, steamed, roasted, or grilled with a minimal amount of fat.  Eat Less Often/Avoid: Creamed or fried vegetables. Vegetables in a cheese sauce. Fruit (4 to 5 servings daily)  Eat More Often: All fresh, canned (in natural juice), or frozen fruits. Dried fruits without added sugar. One hundred percent fruit juice ( cup [237 mL] daily).  Eat Less Often: Dried fruits with added sugar. Canned fruit in light or heavy syrup. [employer]ockerLean Meats, Fish, and Poultry (2 servings or less daily. One serving is 3 to 4 oz [85-114 g]).  Eat More Often: Ninety percent or leaner ground beef, tenderloin, sirloin. Round cuts of beef, chicken breast, [place] breast. All fish. Grill, bake, or broil your meat. Nothing should be fried.  Eat Less Often/Avoid: Fatty cuts of meat, [place], or chicken leg, thigh, or wing. Fried cuts of meat or fish. Dairy (2 to 3 servings)  Eat More Often: Low-fat or fat-free milk, low-fat plain or light yogurt, reduced-fat or part-skim cheese.  Eat Less Often/Avoid: Milk (whole, 2%).Whole milk yogurt. Full-fat cheeses. Nuts, Seeds, and Legumes (4 to 5 servings per week)  Eat More Often: All without added salt.  Eat Less Often/Avoid: Salted nuts and seeds, canned beans with added salt. Fats and Sweets (limited)  Eat More Often: Vegetable oils, tub margarines without trans fats, sugar-free gelatin. Mayonnaise and salad dressings.  Eat Less  Often/Avoid: Coconut oils, palm oils, butter, stick margarine, cream, half and half, cookies, candy, pie. FOR MORE INFORMATION The Dash Diet Eating Plan: www.dashdiet.org Document Released: 01/08/2011 Document Revised: 04/13/2011 Document Reviewed: 01/08/2011 [facility]Exit[facility] Patient Information 2014 [place], [place].

## 2013-05-22 NOTE — Assessment & Plan Note (Signed)
Check tsh in 3 months  Lab Results  Component Value Date   TSH 3.50 05/15/2013

## 2013-05-22 NOTE — Assessment & Plan Note (Signed)
Trial roxerem  When retiring especially on  Work ams .  To see if helps maintain sleep . States is taking other measures to avoid awakening  .  ? If she is a candidate for nuvigil /provigil also . consider sleep consult

## 2013-07-28 ENCOUNTER — Other Ambulatory Visit: Payer: Self-pay | Admitting: Internal Medicine

## 2013-08-14 ENCOUNTER — Other Ambulatory Visit (INDEPENDENT_AMBULATORY_CARE_PROVIDER_SITE_OTHER)

## 2013-08-14 DIAGNOSIS — E039 Hypothyroidism, unspecified: Secondary | ICD-10-CM

## 2013-08-14 LAB — TSH: TSH: 0.61 u[IU]/mL (ref 0.35–4.50)

## 2013-08-21 ENCOUNTER — Encounter: Payer: Self-pay | Admitting: Internal Medicine

## 2013-08-21 ENCOUNTER — Ambulatory Visit (INDEPENDENT_AMBULATORY_CARE_PROVIDER_SITE_OTHER): Admitting: Internal Medicine

## 2013-08-21 VITALS — BP 140/92 | HR 79 | Temp 98.1°F | Ht 62.5 in | Wt 201.0 lb

## 2013-08-21 DIAGNOSIS — G4726 Circadian rhythm sleep disorder, shift work type: Secondary | ICD-10-CM

## 2013-08-21 DIAGNOSIS — R030 Elevated blood-pressure reading, without diagnosis of hypertension: Secondary | ICD-10-CM

## 2013-08-21 DIAGNOSIS — E890 Postprocedural hypothyroidism: Secondary | ICD-10-CM

## 2013-08-21 DIAGNOSIS — I10 Essential (primary) hypertension: Secondary | ICD-10-CM

## 2013-08-21 MED ORDER — TRIAMTERENE-HCTZ 37.5-25 MG PO TABS: 0.5000 | ORAL_TABLET | Freq: Every day | ORAL | Status: DC

## 2013-08-21 NOTE — Patient Instructions (Signed)
Begin  1/2  Tablet of diuretic for blood pressure control Check readings   If not   At goal below 140/90  After 3-4 weeks then increase to 1 per day.  Continue to monitor and keep up informed about  Readings.  return office visit in about 2  months . Will need to get updated BMP potassium level at that time.

## 2013-08-21 NOTE — Progress Notes (Signed)
Pre visit review using our clinic review tool, if applicable. No additional management support is needed unless otherwise documented below in the visit note. 

## 2013-08-21 NOTE — Progress Notes (Signed)
Chief Complaint  Patient presents with  . Follow-up    3 mths    HPI: Deborah GreathouseBetty Mcdowell comes in for fu as directed   BP up and down  130s 140s and  Diastolic mostly in 90 s  And above.  Sleep  :  Sleeping   Tracking   Med expensive and needed pa so didn't pursure getting it  Trying to stay in bed when sleep time THyroid: labd today ROS: See pertinent positives and negatives per HPI. No new cv pulm sx  Sister had edema when went off diuretic   Past Medical History  Diagnosis Date  . Allergy   . GERD (gastroesophageal reflux disease)   . Varicella     as a child  . Vertigo, intermittent     positional neg ct scan  . History of abnormal Pap smear     atypical ASCUS neg HPV neg   . Hyperthyroidism     Family History  Problem Relation Age of Onset  . Hypertension Mother   . Stroke Sister   . Hypertension Sister   . Other Sister     moyamoya  . Crohn's disease Daughter   . Leukemia Sister   . Graves' disease Daughter      brother and husband     History   Social History  . Marital Status: Married    Spouse Name: N/A    Number of Children: N/A  . Years of Education: N/A   Social History Main Topics  . Smoking status: Never Smoker   . Smokeless tobacco: None  . Alcohol Use: No  . Drug Use: No  . Sexual Activity:    Other Topics Concern  . 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 levothyroxine (SYNTHROID, LEVOTHROID) 100 MCG tablet TAKE 1 TABLET DAILY (PATIENT NEEDS TO SCHEDULE APPOINTMENT WITH DR. Everardo AllELLISON FOR FURTHER REFILLS)  . NEXIUM 20 MG capsule  TAKE 1 CAPSULE BEFORE BREAKFAST DAILY  . triamterene-hydrochlorothiazide (MAXZIDE-25) 37.5-25 MG per tablet Take 0.5 tablets by mouth daily. May increase to 1 po qd  . [DISCONTINUED] ramelteon (ROZEREM) 8 MG tablet Take 1 tablet (8 mg total) by mouth at bedtime. As directed    EXAM:  BP 140/92  Pulse 79  Temp(Src) 98.1 F (36.7 C) (Oral)  Ht 5' 2.5" (1.588 m)  Wt 201 lb (91.173 kg)  BMI 36.15 kg/m2  Body mass index is 36.15 kg/(m^2).  GENERAL: vitals reviewed and listed above, alert, oriented, appears well hydrated and in no acute distress HEENT: atraumatic, conjunctiva  clear, no obvious abnormalities on inspection of external nose and ears NECK: no obvious masses on inspection palpation  LUNGS: clear to auscultation bilaterally, no wheezes, rales or rhonchi,CV: HRRR, no clubbing cyanosis or  peripheral edema nl cap refill  MS: moves all extremities without noticeable focal  abnormality PSYCH: pleasant and cooperative, no obvious depression or anxiety Lab Results  Component Value  Date   WBC 7.9 05/15/2013   HGB 13.1 05/15/2013   HCT 41.3 05/15/2013   PLT 322.0 05/15/2013   GLUCOSE 85 05/15/2013   CHOL 196 05/15/2013   TRIG 64.0 05/15/2013   HDL 41.60 05/15/2013   LDLDIRECT 156.6 05/09/2012   LDLCALC 142* 05/15/2013   ALT 18 05/15/2013   AST 23 05/15/2013   NA 141 05/15/2013   K 3.7 05/15/2013   CL 105 05/15/2013   CREATININE 1.0 05/15/2013   BUN 17 05/15/2013   CO2 30 05/15/2013   TSH 0.61 08/14/2013    ASSESSMENT AND PLAN:  Discussed the following assessment and plan:  Unspecified essential hypertension - continued not at goal  options reviewed will begin low dose diuretic and oincfrease if needed fu in 2 months  and monitor in interim  Elevated blood pressure reading  Sleep disorder, shift work - hard to get med from [employer] so didnt pursue this. 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  Patient Instructions  Begin  1/2  Tablet of diuretic for blood pressure control Check readings   If not   At goal below 140/90  After 3-4 weeks then increase to 1 per day.  Continue to monitor and keep up informed about  Readings.  return office visit in about 2  months . Will need to get updated BMP potassium level at that time.   Neta Mends. Panosh M.D.

## 2013-08-22 ENCOUNTER — Telehealth: Payer: Self-pay | Admitting: Internal Medicine

## 2013-08-22 NOTE — Telephone Encounter (Signed)
Relevant patient education assigned to patient using Emmi. ° °

## 2013-09-10 ENCOUNTER — Other Ambulatory Visit: Payer: Self-pay | Admitting: Endocrinology

## 2013-10-08 ENCOUNTER — Other Ambulatory Visit: Payer: Self-pay | Admitting: Internal Medicine

## 2013-10-10 NOTE — Telephone Encounter (Signed)
Sent to the pharmacy by e-scribe. 

## 2013-10-18 ENCOUNTER — Telehealth: Payer: Self-pay | Admitting: Internal Medicine

## 2013-10-18 NOTE — Telephone Encounter (Signed)
Error

## 2013-11-01 ENCOUNTER — Encounter: Payer: Self-pay | Admitting: Internal Medicine

## 2013-11-01 ENCOUNTER — Ambulatory Visit (INDEPENDENT_AMBULATORY_CARE_PROVIDER_SITE_OTHER): Admitting: Internal Medicine

## 2013-11-01 VITALS — BP 118/80 | Temp 98.4°F | Ht 62.5 in | Wt 203.7 lb

## 2013-11-01 DIAGNOSIS — I10 Essential (primary) hypertension: Secondary | ICD-10-CM

## 2013-11-01 LAB — BASIC METABOLIC PANEL
BUN: 22 mg/dL (ref 6–23)
CALCIUM: 10 mg/dL (ref 8.4–10.5)
CHLORIDE: 104 meq/L (ref 96–112)
CO2: 30 mEq/L (ref 19–32)
CREATININE: 1.2 mg/dL (ref 0.4–1.2)
GFR: 59.63 mL/min — ABNORMAL LOW (ref >60.00)
Glucose, Bld: 105 mg/dL — ABNORMAL HIGH (ref 70–99)
Potassium: 4.3 mEq/L (ref 3.5–5.1)
Sodium: 138 mEq/L (ref 135–145)

## 2013-11-01 NOTE — Progress Notes (Signed)
Pre visit review using our clinic review tool, if applicable. No additional management support is needed unless otherwise documented below in the visit note.  Chief Complaint  Patient presents with  . Follow-up    HPI: Deborah Mcdowell here for fu of BP and med management Since last visit  Has readings only taking 1/2 pill diuretic   bp  Readings systolic 120s and 100s  diastili high 80s and some 90 93  lef foot cramps at times   hasnt lost weight no so of med otherwise  ROS: See pertinent positives and negatives per HPI. No cp sob  Past Medical History  Diagnosis Date  . Allergy   . GERD (gastroesophageal reflux disease)   . Varicella     as a child  . Vertigo, intermittent     positional neg ct scan  . History of abnormal Pap smear     atypical ASCUS neg HPV neg   . Hyperthyroidism     Family History  Problem Relation Age of Onset  . Hypertension Mother   . Stroke Sister   . Hypertension Sister   . Other Sister     moyamoya  . Crohn's disease Daughter   . Leukemia Sister   . Graves' disease Daughter      brother and husband     History   Social History  . Marital Status: Married    Spouse Name: N/A    Number of Children: N/A  . Years of Education: N/A   Social History Main Topics  . Smoking status: Never Smoker   . Smokeless tobacco: None  . Alcohol Use: No  . Drug Use: No  . Sexual Activity:    Other Topics Concern  . 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 levothyroxine (SYNTHROID, LEVOTHROID) 100 MCG tablet TAKE 1 TABLET DAILY (PATIENT  NEEDS TO SCHEDULE APPOINTMENT WITH DR. Everardo AllELLISON FOR FURTHER REFILLS)  . NEXIUM 20 MG capsule TAKE 1 CAPSULE DAILY BEFORE BREAKFAST  . triamterene-hydrochlorothiazide (MAXZIDE-25) 37.5-25 MG per tablet Take 0.5 tablets by mouth daily. May increase to 1 po qd    EXAM:  BP 118/80  Temp(Src) 98.4 F (36.9 C) (Oral)  Ht 5' 2.5" (1.588 m)  Wt 203 lb 11.2 oz (92.398 kg)  BMI 36.64 kg/m2  Body mass index is 36.64 kg/(m^2).  GENERAL: vitals reviewed and listed above, alert, oriented, appears well hydrated and in no acute distress HEENT: atraumatic, conjunctiva  clear, no obvious abnormalities on inspection of external nose and ears OP : no lesion edema or exudate  NECK: no obvious masses on inspection palpation  LUNGS: clear to auscultation bilaterally, no wheezes, rales or rhonchi, good air movement CV: HRRR, no clubbing cyanosis or  peripheral edema nl cap refill  MS: moves all extremities without noticeable focal  abnormality PSYCH: pleasant and cooperative, no obvious depression or anxiety BP Readings from Last 3 Encounters:  11/01/13 118/80  08/21/13  140/92  05/22/13 150/84   Lab Results  Component Value Date   WBC 7.9 05/15/2013   HGB 13.1 05/15/2013   HCT 41.3 05/15/2013   PLT 322.0 05/15/2013   GLUCOSE 85 05/15/2013   CHOL 196 05/15/2013   TRIG 64.0 05/15/2013   HDL 41.60 05/15/2013   LDLDIRECT 156.6 05/09/2012   LDLCALC 142* 05/15/2013   ALT 18 05/15/2013   AST 23 05/15/2013   NA 141 05/15/2013   K 3.7 05/15/2013   CL 105 05/15/2013   CREATININE 1.0 05/15/2013   BUN 17 05/15/2013   CO2 30 05/15/2013   TSH 0.61 08/14/2013   Wt Readings from Last 3 Encounters:  11/01/13 203 lb 11.2 oz (92.398 kg)  08/21/13 201 lb (91.173 kg)  05/22/13 203 lb (92.08 kg)     ASSESSMENT AND PLAN:  Discussed the following assessment and plan:  Unspecified essential hypertension - some inproved trial increase to 1 per day then  fu readings 2 months or ov  - Plan: Basic metabolic panel  -Patient  advised to return or notify health care team  if symptoms worsen ,persist or new concerns arise.  Patient Instructions  Increase to 1 per day .  Check potassium level today     Continue to monitor .   ROV or send in readings in 2 months ...   Deborah Mcdowell M.D.

## 2013-11-01 NOTE — Patient Instructions (Signed)
Increase to 1 per day .  Check potassium level today     Continue to monitor .   ROV or send in readings in 2 months .Marland Kitchen..Marland Kitchen

## 2013-11-02 ENCOUNTER — Telehealth: Payer: Self-pay | Admitting: Internal Medicine

## 2013-11-02 NOTE — Telephone Encounter (Signed)
emmi emailed °

## 2013-12-08 ENCOUNTER — Encounter: Payer: Self-pay | Admitting: Internal Medicine

## 2013-12-08 MED ORDER — LEVOTHYROXINE SODIUM 100 MCG PO TABS: ORAL_TABLET | ORAL | Status: DC

## 2013-12-08 NOTE — Telephone Encounter (Signed)
Medication sent to the pharmacy for 90 days.  She is due for follow up.  Will notify her.

## 2014-02-14 ENCOUNTER — Telehealth: Payer: Self-pay | Admitting: Internal Medicine

## 2014-02-14 NOTE — Telephone Encounter (Signed)
Pt would like to have order for kidney function blood work. Can I sch?

## 2014-02-14 NOTE — Telephone Encounter (Signed)
Per last note on 9.30.2015: Return in about 2 months (around 01/01/2014) for blood pressure or send in readings.  Pls advise.

## 2014-02-16 NOTE — Telephone Encounter (Signed)
Ok to do bmp anf OV for results and fu BP

## 2014-02-17 ENCOUNTER — Other Ambulatory Visit: Payer: Self-pay | Admitting: Internal Medicine

## 2014-02-19 ENCOUNTER — Other Ambulatory Visit: Payer: Self-pay | Admitting: Family Medicine

## 2014-02-19 DIAGNOSIS — I10 Essential (primary) hypertension: Secondary | ICD-10-CM

## 2014-02-19 NOTE — Telephone Encounter (Signed)
I have placed the lab order.  Please help the pt to get on the lab schedule and a follow up visit with WP.  Thanks!

## 2014-02-19 NOTE — Telephone Encounter (Addendum)
Pt has sch blood work. Pt will get her work sch and callback to sch OV

## 2014-02-19 NOTE — Telephone Encounter (Signed)
Sent to the pharmacy by e-scribe. 

## 2014-02-23 ENCOUNTER — Other Ambulatory Visit

## 2014-02-27 ENCOUNTER — Encounter: Payer: Self-pay | Admitting: Internal Medicine

## 2014-02-27 ENCOUNTER — Ambulatory Visit (INDEPENDENT_AMBULATORY_CARE_PROVIDER_SITE_OTHER): Admitting: Internal Medicine

## 2014-02-27 VITALS — BP 116/86 | HR 96 | Temp 97.3°F | Ht 62.5 in | Wt 202.5 lb

## 2014-02-27 DIAGNOSIS — I10 Essential (primary) hypertension: Secondary | ICD-10-CM

## 2014-02-27 DIAGNOSIS — H8110 Benign paroxysmal vertigo, unspecified ear: Secondary | ICD-10-CM

## 2014-02-27 DIAGNOSIS — R000 Tachycardia, unspecified: Secondary | ICD-10-CM

## 2014-02-27 DIAGNOSIS — R42 Dizziness and giddiness: Secondary | ICD-10-CM

## 2014-02-27 LAB — CBC WITH DIFFERENTIAL/PLATELET
BASOS PCT: 0.6 % (ref 0.0–3.0)
Basophils Absolute: 0 10*3/uL (ref 0.0–0.1)
EOS PCT: 5 % (ref 0.0–5.0)
Eosinophils Absolute: 0.4 10*3/uL (ref 0.0–0.7)
HEMATOCRIT: 45.1 % (ref 36.0–46.0)
HEMOGLOBIN: 14.5 g/dL (ref 12.0–15.0)
LYMPHS PCT: 25.7 % (ref 12.0–46.0)
Lymphs Abs: 2.1 10*3/uL (ref 0.7–4.0)
MCHC: 32.2 g/dL (ref 30.0–36.0)
MCV: 79.1 fl (ref 78.0–100.0)
MONO ABS: 0.6 10*3/uL (ref 0.1–1.0)
Monocytes Relative: 7.7 % (ref 3.0–12.0)
Neutro Abs: 5.1 10*3/uL (ref 1.4–7.7)
Neutrophils Relative %: 61 % (ref 43.0–77.0)
Platelets: 308 10*3/uL (ref 150.0–400.0)
RBC: 5.7 Mil/uL — AB (ref 3.87–5.11)
RDW: 16.5 % — AB (ref 11.5–15.5)
WBC: 8.3 10*3/uL (ref 4.0–10.5)

## 2014-02-27 LAB — T3, FREE: T3 FREE: 4.1 pg/mL (ref 2.3–4.2)

## 2014-02-27 LAB — TSH: TSH: 0.88 u[IU]/mL (ref 0.35–4.50)

## 2014-02-27 LAB — T4, FREE: Free T4: 0.96 ng/dL (ref 0.60–1.60)

## 2014-02-27 NOTE — Progress Notes (Signed)
Pre visit review using our clinic review tool, if applicable. No additional management support is needed unless otherwise documented below in the visit note.  Chief Complaint  Patient presents with  . Dizziness    HPI: Patient Deborah Mcdowell  comes in today for SDA for  new problem evaluation. 2 episodes of vertigo hspinning  That waxed and waned for day  . Has missed some work over the past 3 weeks cause of this .  Getsw better then comes back  Happened  3 weeks ago after hair washing and then firday  Upon awakening  After laying down and sitting up Lasted 5 days.  Spinning without vomiting or other neuro changes  thelaid down .   Now it is better   ? If going to come back.  bp good  Bu hr up. Some hr when trying to exercise  No related ,  No vision hearing issues  Sometimes discomfort right neck eararea no cold cough  Fever. uris.  Works 12 hour shifts 3 days long night shifts.  M[place] dew diet . No etoh  ROS: See pertinent positives and negatives per HPI.no falling   Past Medical History  Diagnosis Date  . Allergy   . GERD (gastroesophageal reflux disease)   . Varicella     as a child  . Vertigo, intermittent     positional neg ct scan  . History of abnormal Pap smear     atypical ASCUS neg HPV neg   . Hyperthyroidism     Family History  Problem Relation Age of Onset  . Hypertension Mother   . Stroke Sister   . Hypertension Sister   . Other Sister     moyamoya  . Crohn's disease Daughter   . Leukemia Sister   . Graves' disease Daughter      brother and husband     History   Social History  . Marital Status: Married    Spouse Name: N/A    Number of Children: N/A  . Years of Education: N/A   Social History Main Topics  . Smoking status: Never Smoker   . Smokeless tobacco: None  . Alcohol Use: No  . Drug Use: No  . Sexual Activity: None   Other Topics Concern  . 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 levothyroxine (SYNTHROID, LEVOTHROID) 100 MCG tablet TAKE 1 TABLET DAILY (NEED TO SCHEDULE APPOINTMENT WITH DR. Everardo AllELLISON FOR FURTHER REFILLS)  . NEXIUM 20 MG capsule TAKE 1 CAPSULE DAILY BEFORE BREAKFAST  . triamterene-hydrochlorothiazide (MAXZIDE-25) 37.5-25 MG per tablet Take 0.5 tablets by mouth daily. May increase to 1 po qd    EXAM:  BP 116/86 mmHg  Pulse 96  Temp(Src) 97.3 F (36.3 C) (Oral)  Ht 5' 2.5" (1.588 m)  Wt 202 lb 8 oz (91.853 kg)  BMI 36.42 kg/m2  SpO2 98%  Body mass index is 36.42 kg/(m^2).  GENERAL: vitals  reviewed and listed above, alert, oriented, appears well hydrated and in no acute distress midl discomfort when laying down left later al nystagmus extinguishes   HEENT: atraumatic, conjunctiva  clear, no obvious abnormalities on inspection of external nose and ears OP : no lesion edema or exudate  NECK: no obvious masses on inspection palpation  LUNGS: clear to auscultation bilaterally, no wheezes, rales or rhonchi,  CV: HRRR, no clubbing cyanosis or  peripheral edema nl cap refill  Abdomen:  Sof,t normal bowel sounds without hepatosplenomegaly, no guarding rebound or masses no CVA tenderness MS: moves all extremities without noticeable focal  abnormality PSYCH: pleasant and cooperative, no obvious depression or anxiety looks tired and uncomfortable   Neuro intact non focal except the vertigo and nystagmus when laying  Non focal exam  Neg rhonberg  ASSESSMENT AND PLAN:  Discussed the following assessment and plan:  Vertigo - bpv most likely cause to pt r/o metabolic changes - Plan: CBC with  Differential/Platelet, TSH, T4, free, T3, free, Ambulatory referral to Physical Therapy  BPV (benign positional vertigo), unspecified laterality - seems positional  when lays down verticl changes less when up - Plan: Ambulatory referral to Physical Therapy  Increased pulse rate - today ? fluid status  check tsh - Plan: CBC with Differential/Platelet, TSH, T4, free, T3, free  Essential hypertension - Plan: Basic metabolic panel  -Patient advised to return or notify health care team  if symptoms worsen ,persist or new concerns arise.  Patient Instructions  The vertigo sounds like BPV  But labs because of the elevaetd heart rate  ( could be dehydration meds etc.P Will do a referral to pt for  Vertigo rx asap.  Will notify you  of labs when available.   Benign Positional Vertigo Vertigo means you feel like you or your surroundings are moving when they are not. Benign positional vertigo is the most common form of vertigo. Benign means that the cause of your condition is not serious. Benign positional vertigo is more common in older adults. CAUSES  Benign positional vertigo is the result of an upset in the labyrinth system. This is an area in the middle ear that helps control your balance. This may be caused by a viral infection, head injury, or repetitive motion. However, often no specific cause is found. SYMPTOMS  Symptoms of benign positional vertigo occur when you move your head or eyes in different directions. Some of the symptoms may include:  Loss of balance and falls.  Vomiting.  Blurred vision.  Dizziness.  Nausea.  Involuntary eye movements (nystagmus). DIAGNOSIS  Benign positional vertigo is usually diagnosed by physical exam. If the specific cause of your benign positional vertigo is unknown, your caregiver may perform imaging tests, such as magnetic resonance imaging (MRI) or computed tomography (CT). TREATMENT  Your caregiver may recommend movements or procedures to  correct the benign positional vertigo. Medicines such as meclizine, benzodiazepines, and medicines for nausea may be used to treat your symptoms. In rare cases, if your symptoms are caused by certain conditions that affect the inner ear, you may need surgery. HOME CARE INSTRUCTIONS   Follow your caregiver's instructions.  Move slowly. Do not make sudden body or head movements.  Avoid driving.  Avoid operating heavy machinery.  Avoid performing any tasks that would be dangerous to you or others during a vertigo episode.  Drink enough fluids to keep your urine clear or pale yellow. SEEK IMMEDIATE MEDICAL CARE IF:   You develop problems with walking, weakness,  numbness, or using your arms, hands, or legs.  You have difficulty speaking.  You develop severe headaches.  Your nausea or vomiting continues or gets worse.  You develop visual changes.  Your family or friends notice any behavioral changes.  Your condition gets worse.  You have a fever.  You develop a stiff neck or sensitivity to light. MAKE SURE YOU:   Understand these instructions.  Will watch your condition.  Will get help right away if you are not doing well or get worse. Document Released: 10/27/2005 Document Revised: 04/13/2011 Document Reviewed: 10/09/2010 [facility] Patient Information 2015 [place], [place]. This information is not intended to replace advice given to you by your health care provider. Make sure you discuss any questions you have with your health care provider.        Neta Mends. Lorrin Bodner M.D. Lab Results  Component Value Date   WBC 7.9 05/15/2013   HGB 13.1 05/15/2013   HCT 41.3 05/15/2013   PLT 322.0 05/15/2013   GLUCOSE 105* 11/01/2013   CHOL 196 05/15/2013   TRIG 64.0 05/15/2013   HDL 41.60 05/15/2013   LDLDIRECT 156.6 05/09/2012   LDLCALC 142* 05/15/2013   ALT 18 05/15/2013   AST 23 05/15/2013   NA 138 11/01/2013   K 4.3 11/01/2013   CL 104 11/01/2013   CREATININE 1.2  11/01/2013   BUN 22 11/01/2013   CO2 30 11/01/2013   TSH 0.61 08/14/2013

## 2014-02-27 NOTE — Patient Instructions (Signed)
The vertigo sounds like BPV  But labs because of the elevaetd heart rate  ( could be dehydration meds etc.P Will do a referral to pt for  Vertigo rx asap.  Will notify you  of labs when available.   Benign Positional Vertigo Vertigo means you feel like you or your surroundings are moving when they are not. Benign positional vertigo is the most common form of vertigo. Benign means that the cause of your condition is not serious. Benign positional vertigo is more common in older adults. CAUSES  Benign positional vertigo is the result of an upset in the labyrinth system. This is an area in the middle ear that helps control your balance. This may be caused by a viral infection, head injury, or repetitive motion. However, often no specific cause is found. SYMPTOMS  Symptoms of benign positional vertigo occur when you move your head or eyes in different directions. Some of the symptoms may include:  Loss of balance and falls.  Vomiting.  Blurred vision.  Dizziness.  Nausea.  Involuntary eye movements (nystagmus). DIAGNOSIS  Benign positional vertigo is usually diagnosed by physical exam. If the specific cause of your benign positional vertigo is unknown, your caregiver may perform imaging tests, such as magnetic resonance imaging (MRI) or computed tomography (CT). TREATMENT  Your caregiver may recommend movements or procedures to correct the benign positional vertigo. Medicines such as meclizine, benzodiazepines, and medicines for nausea may be used to treat your symptoms. In rare cases, if your symptoms are caused by certain conditions that affect the inner ear, you may need surgery. HOME CARE INSTRUCTIONS   Follow your caregiver's instructions.  Move slowly. Do not make sudden body or head movements.  Avoid driving.  Avoid operating heavy machinery.  Avoid performing any tasks that would be dangerous to you or others during a vertigo episode.  Drink enough fluids to keep your  urine clear or pale yellow. SEEK IMMEDIATE MEDICAL CARE IF:   You develop problems with walking, weakness, numbness, or using your arms, hands, or legs.  You have difficulty speaking.  You develop severe headaches.  Your nausea or vomiting continues or gets worse.  You develop visual changes.  Your family or friends notice any behavioral changes.  Your condition gets worse.  You have a fever.  You develop a stiff neck or sensitivity to light. MAKE SURE YOU:   Understand these instructions.  Will watch your condition.  Will get help right away if you are not doing well or get worse. Document Released: 10/27/2005 Document Revised: 04/13/2011 Document Reviewed: 10/09/2010 [facility] Patient Information 2015 [place]. This information is not intended to replace advice given to you by your health care provider. Make sure you discuss any questions you have with your health care provider.

## 2014-02-28 LAB — BASIC METABOLIC PANEL
BUN: 25 mg/dL — ABNORMAL HIGH (ref 6–23)
CO2: 29 meq/L (ref 19–32)
Calcium: 10.1 mg/dL (ref 8.4–10.5)
Chloride: 99 mEq/L (ref 96–112)
Creatinine, Ser: 1.17 mg/dL (ref 0.40–1.20)
GFR: 62.5 mL/min (ref >60.00)
GLUCOSE: 109 mg/dL — AB (ref 70–99)
Potassium: 3.8 mEq/L (ref 3.5–5.1)
Sodium: 136 mEq/L (ref 135–145)

## 2014-03-02 ENCOUNTER — Ambulatory Visit: Attending: Internal Medicine | Admitting: Rehabilitative and Restorative Service Providers"

## 2014-03-02 DIAGNOSIS — R42 Dizziness and giddiness: Secondary | ICD-10-CM | POA: Diagnosis not present

## 2014-03-02 NOTE — Therapy (Signed)
[facility] [address] [place], [place], [address] Phone: [phone]   Fax:  [phone]  Physical Therapy Evaluation  Patient Details  Name: Deborah Mcdowell MRN: [id number] Date of Birth: 11-10-1960 Referring Provider:  Madelin Headings, MD  Encounter Date: 03/02/2014      PT End of Session - 03/02/14 1725    Visit Number 1   Number of Visits 4   Date for PT Re-Evaluation 04/17/14   PT Start Time 1020   PT Stop Time 1102   PT Time Calculation (min) 42 min   Activity Tolerance Patient tolerated treatment well      Past Medical History  Diagnosis Date  . Allergy   . GERD (gastroesophageal reflux disease)   . Varicella     as a child  . Vertigo, intermittent     positional neg ct scan  . History of abnormal Pap smear     atypical ASCUS neg HPV neg   . Hyperthyroidism     Past Surgical History  Procedure Laterality Date  . Colposcopy  2001  . Liposuction multiple body parts  fall 2011    There were no vitals taken for this visit.  Visit Diagnosis:  Dizziness and giddiness - Plan: PT plan of care cert/re-cert      Subjective Assessment - 03/02/14 1023    Symptoms The patient reports that her symptoms are improved since being referred.  She reports waking yesterday morning feeling that the dizziness was significantly improved.  She want to be evaluated  and learn exercises to help dizziness.  The patient reports 3 weeks ago waking with sudden onset of room spinning lasting 5 days (constant).  She also noted R ear pain and face pain.  Initial symptoms improved and then returned last Friday.  She was diagnosed with positional vertigo 2003-2004 and it recently recurred.    Patient Stated Goals learn exercises for dizziness   Currently in Pain? No/denies          [facility] PT Assessment - 03/02/14 0001    Assessment   Medical Diagnosis BPPV   Onset Date --  02/2014   Prior Therapy --  self treatment at home  per internet   Balance Screen   Has the patient fallen in the past 6 months No   Has the patient had a decrease in activity level because of a fear of falling?  Yes   Is the patient reluctant to leave their home because of a fear of falling?  No   Prior Function   Vocation Full time employment  RN, limited mobility (unable to attend recently)   Observation/Other Assessments   Focus on Therapeutic Outcomes (FOTO)  74%   Other Surveys  --  DHI=36%   Ambulation/Gait   Ambulation/Gait Yes   Ambulation/Gait Assistance 7: Independent   Gait velocity mild decrease in speed noted   High Level Balance   High Level Balance Comments single limb stance, tandem stance with close supervision.  Pt maintains 10 sec L SLS and 5 sec R SLS.  Pt with increased sway with feet together/eyes closed            Vestibular Assessment - 03/02/14 1033    Type of Dizziness Spinning  Pain and pressure in her R eye and down R neck   Frequency of Dizziness daily   Duration of Dizziness --  seconds to minutes   Aggravating Factors Turning body quickly;Turning head quickly  worse to the  right   Relieving Factors Lying supine   Occulomotor Alignment Normal   Spontaneous Absent   Gaze-induced Absent   Comment --  No hearing changes, neg h/o migraines   VOR 1 Head Only (x 1 viewing) --  apprehensive with movement and R eye pain   VOR Cancellation Normal   Comment --  Head thrust test positive for dizziness with pt eyes closed   Dynamic --  4 line difference sva vs. dva indicating decreased use VOR   Dix-Hallpike Dix-Hallpike Right;Dix-Hallpike Left   Sidelying Test Sidelying Right;Sidelying Left   Horizontal Canal Testing Horizontal Canal Right;Horizontal Canal Left   Dix-Hallpike Right Symptoms No nystagmus  no dizziness   Dix-Hallpike Left Symptoms No nystagmus  no dizziness   Sidelying Right Symptoms No nystagmus  no dizziness   Sidelying Left Symptoms No nystagmus  no dizziness   Horizontal  Canal Right Symptoms Normal   Horizontal Canal Left Symptoms Normal              PT Education - 03/02/14 1725    Education provided Yes   Education Details HEP: gaze stabilization, standing feet together with eyes closed, tandem standing   Person(s) Educated Patient   Methods Explanation;Demonstration;Handout   Comprehension Verbalized understanding;Returned demonstration             PT Long Term Goals - 03/02/14 1731    PT LONG TERM GOAL #1   Title The patient will be indep with HEP for gaze and high level balance.  Target date 04/17/2014    Time 6   Period Weeks   PT LONG TERM GOAL #2   Title The patient will improve DHI from 36% down to < or equal to 25% for decreased dizziness.  Target date 04/17/2014   Time 6   Period Weeks   PT LONG TERM GOAL #3   Title The patient will perform tandem stance with eyes open x 30 seconds for improved balance.  Target date 04/17/2014   Time 6   Period Weeks   PT LONG TERM GOAL #4   Title The patient will tolerate gaze adaptation x 1 viewing x 30 seconds without subjective reports of dizziness.  Target date 04/17/2014   Time 6   Period Weeks               Plan - 03/02/14 1726    Clinical Impression Statement The patient is a 54 yo female presenting to physical therapy today for vestibular rehab.  At today's visit, she reports positional type vertigo symptoms resolved 2 days ago.  Her initial onset of vertigo symptoms >3-4 weeks ago sounded consistent with possible neuritis as she describes a constant sensation of vertigo that began improving after a few days of illness.  The type of symptoms she reported from last week appeared positional in nature and improved this week.  The patient currently continues with occasional subjective dizziness, decreased use of vestibular ocular reflex noted in today's exam with difficulty with gaze stabilization activities, and decreased high level balance.  PT addressed via HEP today and will  progress to tolerance.   Pt will benefit from skilled therapeutic intervention in order to improve on the following deficits Decreased balance;Other (comment);Abnormal gait  decreased gaze stability/VOR, dizziness with position changes   Rehab Potential Good   PT Frequency 1x / week  to every other week as indicated   PT Duration 6 weeks   PT Treatment/Interventions Functional mobility training;Therapeutic activities;Patient/family education;Therapeutic exercise;Balance training;Gait training;Neuromuscular re-education;Other (comment)  vestibular rehab   PT Next Visit Plan Check HEP, reassess for vertigo based on clinical presentation   Consulted and Agree with Plan of Care Patient         Problem List Patient Active Problem List   Diagnosis Date Noted  . Unspecified essential hypertension 08/21/2013  . Sleep disorder, shift work 05/22/2013  . Fatigue 05/22/2013  . Menopausal bleeding 09/19/2012  . Weight gain 09/19/2012  . GERD (gastroesophageal reflux disease) 05/22/2012  . Elevated blood pressure reading 05/16/2012  . Visit for preventive health examination 05/16/2012  . Other postablative hypothyroidism 01/11/2012  . Acute radicular low back pain 05/29/2011  . Routine gynecological examination 05/14/2011  . Routine general medical examination at a health care facility 05/14/2011  . Perimenopausal 05/14/2011  . History of abnormal Pap smear   . Ankle pain, left 08/14/2010  . Left ankle swelling 08/14/2010  . Other and unspecified hyperlipidemia 05/15/2010  . Foot pain, right 05/15/2010  . OBESITY 10/17/2009  . PERIMENOPAUSAL SYNDROME 04/24/2009  . KNEE PAIN 04/24/2009  . ALLERGIC RHINITIS 12/04/2008  . GERD 12/04/2008  . VERTIGO, POSITIONAL, HX OF 12/04/2008    Tacey Dimaggio, PT 03/02/2014, 5:35 PM  [facility] [address] [place], [place], [address] Phone: [phone]   Fax:  [phone]

## 2014-03-02 NOTE — Patient Instructions (Signed)
Gaze Stabilization: Tip Card 1.Target must remain in focus, not blurry, and appear stationary while head is in motion. 2.Perform exercises with small head movements (45 to either side of midline). 3.Increase speed of head motion so long as target is in focus. 4.If you wear eyeglasses, be sure you can see target through lens (therapist will give specific instructions for bifocal / progressive lenses). 5.These exercises may provoke dizziness or nausea. Work through these symptoms. If too dizzy, slow head movement slightly. Rest between each exercise. 6.Exercises demand concentration; avoid distractions. 7.For safety, perform standing exercises close to a counter, wall, corner, or next to someone.  Copyright  VHI. All rights reserved.  Gaze Stabilization: Standing Feet Apart   Feet shoulder width apart, keeping eyes on target on wall __3-5__ feet away, tilt head down 15-30 and move head side to side for _30___ seconds. Repeat while moving head up and down for _30___ seconds. Do __2__ sessions per day. Repeat using target on pattern background.  Copyright  VHI. All rights reserved.  Feet Heel-Toe "Tandem", Varied Arm Positions - Eyes Open   With eyes open, right foot directly in front of the other, arms at your side, look straight ahead at a stationary object. Hold _30___ seconds. Repeat __2__ times with each foot forward per session. Do _2___ sessions per day.  Copyright  VHI. All rights reserved.  Feet Together, Varied Arm Positions - Eyes Closed   Stand with feet together and arms at side. Close eyes and visualize upright position. Hold _30___ seconds. Repeat _2___ times per session. Do _2___ sessions per day.  Copyright  VHI. All rights reserved.

## 2014-03-07 DIAGNOSIS — Z0279 Encounter for issue of other medical certificate: Secondary | ICD-10-CM

## 2014-03-19 ENCOUNTER — Ambulatory Visit: Admitting: Rehabilitative and Restorative Service Providers"

## 2014-04-06 ENCOUNTER — Ambulatory Visit: Attending: Internal Medicine | Admitting: Rehabilitative and Restorative Service Providers"

## 2014-04-06 ENCOUNTER — Encounter: Payer: Self-pay | Admitting: Rehabilitative and Restorative Service Providers"

## 2014-04-06 DIAGNOSIS — R42 Dizziness and giddiness: Secondary | ICD-10-CM | POA: Diagnosis present

## 2014-04-06 NOTE — Therapy (Signed)
[facility] [address] [place], [place], [address] Phone: [phone]   Fax:  [phone]  Physical Therapy Treatment  Patient Details  Name: Deborah Mcdowell MRN: [id number] Date of Birth: 03-24-1960 Referring Provider:  Burnis Medin, MD  Encounter Date: 04/06/2014      PT End of Session - 04/06/14 0820    Visit Number 2   Number of Visits 4   Date for PT Re-Evaluation 04/17/14   PT Start Time 0800   PT Stop Time 0815   PT Time Calculation (min) 15 min   Activity Tolerance Patient tolerated treatment well      Past Medical History  Diagnosis Date  . Allergy   . GERD (gastroesophageal reflux disease)   . Varicella     as a child  . Vertigo, intermittent     positional neg ct scan  . History of abnormal Pap smear     atypical ASCUS neg HPV neg   . Hyperthyroidism     Past Surgical History  Procedure Laterality Date  . Colposcopy  2001  . Liposuction multiple body parts  fall 2011    There were no vitals taken for this visit.  Visit Diagnosis:  Dizziness and giddiness      Subjective Assessment - 04/06/14 0806    Symptoms The patient feels symptoms are resolved.  She has returned to work, has stopped doing HEP because they were no longer provoking symtpoms.      The patient does report one episode of dizziness that improved with use of a decongestant.    NEUROMUSCULAR RE-EDUCATION: Reviewed tandem stance and patient able to perform 30+ seconds without loss of balance with eyes opened. Reviewed gaze x 1 viewing.  Patient still apprehensive with quick head movements.  PT demonstrated speed at which to perform exercise and recommended continuing exercise with increase to 60 seconds of gaze adaptation activities.  The patient does not experience any dizziness with this exercise, but does however report visual blurring when performing at a fast pace.   Pt completed dizziness handicap  index            PT Education - 04/06/14 0819    Education provided Yes   Education Details Continue HEP: increased gaze stabilization to 60 seconds and reviewed activity to progress at home.   Person(s) Educated Patient   Methods Explanation;Demonstration   Comprehension Verbalized understanding;Returned demonstration             PT Long Term Goals - 04/06/14 0807    PT LONG TERM GOAL #1   Title The patient will be indep with HEP for gaze and high level balance.  Target date 04/17/2014    Baseline met on 04/06/2014   Time 6   Period Weeks   Status Achieved   PT LONG TERM GOAL #2   Title The patient will improve DHI from 36% down to < or equal to 25% for decreased dizziness.  Target date 04/17/2014   Baseline DHI=0% disability from dizziness.   Time 6   Period Weeks   Status Achieved   PT LONG TERM GOAL #3   Title The patient will perform tandem stance with eyes open x 30 seconds for improved balance.  Target date 04/17/2014   Baseline Met on 04/06/2014   Time 6   Period Weeks   Status Achieved   PT LONG TERM GOAL #4   Title The patient will tolerate gaze adaptation x 1 viewing x 30  seconds without subjective reports of dizziness.  Target date 04/17/2014   Baseline Met on 04/06/2014   Time 6   Period Weeks   Status Achieved               Plan - 04/06/14 0827    Clinical Impression Statement The patient met 4/4 LTGs.  Patient to continue gaze x 1 exercise at faster pace for longer duration to continue to progress activity.     PT Next Visit Plan d/c   Consulted and Agree with Plan of Care Patient        Problem List Patient Active Problem List   Diagnosis Date Noted  . Unspecified essential hypertension 08/21/2013  . Sleep disorder, shift work 05/22/2013  . Fatigue 05/22/2013  . Menopausal bleeding 09/19/2012  . Weight gain 09/19/2012  . GERD (gastroesophageal reflux disease) 05/22/2012  . Elevated blood pressure reading 05/16/2012  . Visit for  preventive health examination 05/16/2012  . Other postablative hypothyroidism 01/11/2012  . Acute radicular low back pain 05/29/2011  . Routine gynecological examination 05/14/2011  . Routine general medical examination at a health care facility 05/14/2011  . Perimenopausal 05/14/2011  . History of abnormal Pap smear   . Ankle pain, left 08/14/2010  . Left ankle swelling 08/14/2010  . Other and unspecified hyperlipidemia 05/15/2010  . Foot pain, right 05/15/2010  . OBESITY 10/17/2009  . PERIMENOPAUSAL SYNDROME 04/24/2009  . KNEE PAIN 04/24/2009  . ALLERGIC RHINITIS 12/04/2008  . GERD 12/04/2008  . VERTIGO, POSITIONAL, HX OF 12/04/2008    Everson Mott, PT 04/06/2014, 8:27 AM  [facility] [address] [place], [place], [address] Phone: [phone]   Fax:  [phone]

## 2014-04-06 NOTE — Therapy (Signed)
[facility] [address] [place], [place], [address] Phone: [phone]   Fax:  [phone]  Patient Details  Name: Deborah Mcdowell MRN: [id number] Date of Birth: 05-11-1960 Referring Provider:  No ref. provider found  Encounter Date: 04/06/2014  PHYSICAL THERAPY DISCHARGE SUMMARY  Visits from Start of Care: 2  Current functional level related to goals / functional outcomes:     PT Long Term Goals - 04/06/14 3552    PT LONG TERM GOAL #1   Title The patient will be indep with HEP for gaze and high level balance.  Target date 04/17/2014    Baseline met on 04/06/2014   Time 6   Period Weeks   Status Achieved   PT LONG TERM GOAL #2   Title The patient will improve DHI from 36% down to < or equal to 25% for decreased dizziness.  Target date 04/17/2014   Baseline DHI=0% disability from dizziness.   Time 6   Period Weeks   Status Achieved   PT LONG TERM GOAL #3   Title The patient will perform tandem stance with eyes open x 30 seconds for improved balance.  Target date 04/17/2014   Baseline Met on 04/06/2014   Time 6   Period Weeks   Status Achieved   PT LONG TERM GOAL #4   Title The patient will tolerate gaze adaptation x 1 viewing x 30 seconds without subjective reports of dizziness.  Target date 04/17/2014   Baseline Met on 04/06/2014   Time 6   Period Weeks   Status Achieved        Remaining deficits: None per patient report.   Education / Equipment: Patient has comprehensive HEP and PT recommends continuing gaze x 1 viewing.  Plan: Patient agrees to discharge.  Patient goals were met. Patient is being discharged due to meeting the stated rehab goals.  ?????   Thank you for the referral of this patient.    [place], [place] 04/06/2014, 8:28 AM  [facility] [address] [place], [place], [address] Phone: [phone]   Fax:  [phone]

## 2014-05-30 ENCOUNTER — Telehealth: Payer: Self-pay | Admitting: Family Medicine

## 2014-05-30 ENCOUNTER — Other Ambulatory Visit: Payer: Self-pay | Admitting: Family Medicine

## 2014-05-30 ENCOUNTER — Other Ambulatory Visit: Payer: Self-pay | Admitting: Internal Medicine

## 2014-05-30 DIAGNOSIS — Z0000 Encounter for general adult medical examination without abnormal findings: Secondary | ICD-10-CM

## 2014-05-30 NOTE — Telephone Encounter (Signed)
vm not set up

## 2014-05-30 NOTE — Telephone Encounter (Signed)
Pt has been sch

## 2014-05-30 NOTE — Telephone Encounter (Signed)
Pt is now due for CPX and lab work.  Please help the pt make both appointments. I have placed the orders for lab work.  Thanks!

## 2014-05-30 NOTE — Telephone Encounter (Signed)
Sent #90 to the pharmacy.  Pt now due for CPX.  Will send a message to scheduling to help her make lab and CPX appt.

## 2014-07-30 ENCOUNTER — Other Ambulatory Visit: Payer: Self-pay | Admitting: Internal Medicine

## 2014-07-31 NOTE — Telephone Encounter (Signed)
Sent to the pharmacy by e-scribe.  Pt has upcoming cpx on 10/10/14

## 2014-08-26 ENCOUNTER — Other Ambulatory Visit: Payer: Self-pay | Admitting: Internal Medicine

## 2014-08-27 NOTE — Telephone Encounter (Signed)
Sent to the pharmacy by e-scribe.  Pt has an appointment on 10/10/2014

## 2014-10-03 ENCOUNTER — Other Ambulatory Visit (INDEPENDENT_AMBULATORY_CARE_PROVIDER_SITE_OTHER)

## 2014-10-03 DIAGNOSIS — Z0000 Encounter for general adult medical examination without abnormal findings: Secondary | ICD-10-CM

## 2014-10-03 LAB — CBC WITH DIFFERENTIAL/PLATELET
BASOS ABS: 0 10*3/uL (ref 0.0–0.1)
Basophils Relative: 0.6 % (ref 0.0–3.0)
Eosinophils Absolute: 0.4 10*3/uL (ref 0.0–0.7)
Eosinophils Relative: 5.8 % — ABNORMAL HIGH (ref 0.0–5.0)
HEMATOCRIT: 41.6 % (ref 36.0–46.0)
HEMOGLOBIN: 13.3 g/dL (ref 12.0–15.0)
LYMPHS PCT: 34.7 % (ref 12.0–46.0)
Lymphs Abs: 2.3 10*3/uL (ref 0.7–4.0)
MCHC: 32 g/dL (ref 30.0–36.0)
MCV: 80.1 fl (ref 78.0–100.0)
Monocytes Absolute: 0.6 10*3/uL (ref 0.1–1.0)
Monocytes Relative: 8.2 % (ref 3.0–12.0)
Neutro Abs: 3.4 10*3/uL (ref 1.4–7.7)
Neutrophils Relative %: 50.7 % (ref 43.0–77.0)
PLATELETS: 289 10*3/uL (ref 150.0–400.0)
RBC: 5.2 Mil/uL — ABNORMAL HIGH (ref 3.87–5.11)
RDW: 16.6 % — ABNORMAL HIGH (ref 11.5–15.5)
WBC: 6.7 10*3/uL (ref 4.0–10.5)

## 2014-10-03 LAB — BASIC METABOLIC PANEL
BUN: 18 mg/dL (ref 6–23)
CALCIUM: 9.6 mg/dL (ref 8.4–10.5)
CO2: 31 mEq/L (ref 19–32)
CREATININE: 0.99 mg/dL (ref 0.40–1.20)
Chloride: 102 mEq/L (ref 96–112)
GFR: 75.62 mL/min (ref >60.00)
Glucose, Bld: 107 mg/dL — ABNORMAL HIGH (ref 70–99)
Potassium: 3.8 mEq/L (ref 3.5–5.1)
Sodium: 139 mEq/L (ref 135–145)

## 2014-10-03 LAB — HEPATIC FUNCTION PANEL
ALT: 16 U/L (ref 0–35)
AST: 21 U/L (ref 0–37)
Albumin: 4.1 g/dL (ref 3.5–5.2)
Alkaline Phosphatase: 63 U/L (ref 39–117)
BILIRUBIN DIRECT: 0.1 mg/dL (ref 0.0–0.3)
TOTAL PROTEIN: 7.8 g/dL (ref 6.0–8.3)
Total Bilirubin: 0.4 mg/dL (ref 0.2–1.2)

## 2014-10-03 LAB — LIPID PANEL
CHOLESTEROL: 209 mg/dL — AB (ref 0–200)
HDL: 40.9 mg/dL (ref >39.00)
LDL CALC: 154 mg/dL — AB (ref 0–99)
NonHDL: 168.51
Total CHOL/HDL Ratio: 5
Triglycerides: 72 mg/dL (ref 0.0–149.0)
VLDL: 14.4 mg/dL (ref 0.0–40.0)

## 2014-10-03 LAB — TSH: TSH: 2.4 u[IU]/mL (ref 0.35–4.50)

## 2014-10-10 ENCOUNTER — Ambulatory Visit (INDEPENDENT_AMBULATORY_CARE_PROVIDER_SITE_OTHER): Admitting: Internal Medicine

## 2014-10-10 ENCOUNTER — Other Ambulatory Visit (HOSPITAL_COMMUNITY)
Admission: RE | Admit: 2014-10-10 | Discharge: 2014-10-10 | Disposition: A | Discharge status: Home / Self Care | Source: Ambulatory Visit | Attending: Internal Medicine | Admitting: Internal Medicine

## 2014-10-10 ENCOUNTER — Encounter: Payer: Self-pay | Admitting: Internal Medicine

## 2014-10-10 VITALS — BP 116/80 | Temp 98.3°F | Ht 62.5 in | Wt 213.8 lb

## 2014-10-10 DIAGNOSIS — I10 Essential (primary) hypertension: Secondary | ICD-10-CM

## 2014-10-10 DIAGNOSIS — R7301 Impaired fasting glucose: Secondary | ICD-10-CM

## 2014-10-10 DIAGNOSIS — E785 Hyperlipidemia, unspecified: Secondary | ICD-10-CM

## 2014-10-10 DIAGNOSIS — Z0000 Encounter for general adult medical examination without abnormal findings: Secondary | ICD-10-CM | POA: Diagnosis not present

## 2014-10-10 DIAGNOSIS — Z1151 Encounter for screening for human papillomavirus (HPV): Secondary | ICD-10-CM | POA: Insufficient documentation

## 2014-10-10 DIAGNOSIS — Z01419 Encounter for gynecological examination (general) (routine) without abnormal findings: Secondary | ICD-10-CM | POA: Insufficient documentation

## 2014-10-10 DIAGNOSIS — K219 Gastro-esophageal reflux disease without esophagitis: Secondary | ICD-10-CM

## 2014-10-10 DIAGNOSIS — Z1211 Encounter for screening for malignant neoplasm of colon: Secondary | ICD-10-CM

## 2014-10-10 DIAGNOSIS — E890 Postprocedural hypothyroidism: Secondary | ICD-10-CM

## 2014-10-10 MED ORDER — TRIAMTERENE-HCTZ 37.5-25 MG PO TABS: 0.5000 | ORAL_TABLET | Freq: Every day | ORAL | Status: DC

## 2014-10-10 MED ORDER — LEVOTHYROXINE SODIUM 100 MCG PO TABS: ORAL_TABLET | ORAL | Status: DC

## 2014-10-10 NOTE — Progress Notes (Signed)
Pre visit review using our clinic review tool, if applicable. No additional management support is needed unless otherwise documented below in the visit note.  Chief Complaint  Patient presents with  . Annual Exam    HPI: Patient  Deborah Mcdowell  54 y.o. comes in today for Preventive Health Care visit   BP: med   Dropped too low.    100  In am .   So dec to 1/2 tab diuretic and dizziness helped .  ocass nexium   Gets rebound  When trying to wean.  Night shifts  Gained weight  Used MD  [place]  Knows needs to do better   No gi bleeding  lmp about 2 years ago    No hx of allergy .  Health Maintenance  Topic Date Due  . Hepatitis C Screening  02[birth year]/1962  . MAMMOGRAM  04/19/2012  . COLONOSCOPY  04/19/2012  . PAP SMEAR  05/14/2014  . INFLUENZA VACCINE  10/10/2015 (Originally 09/03/2014)  . HIV Screening  10/10/2015 (Originally 04/19/1977)  . TETANUS/TDAP  02/02/2017   Health Maintenance Review LIFESTYLE:  Exercise:   Hot. Not recenetly Tobacco/ETS: no Alcohol: per day  no Sugar beverages: soda  Work  .  MD  Sleep: about 4-5 hours .  To change sched to   releif in January  Drug use: no  ROS:  GEN/ HEENT: No fever, significant weight changes sweats headaches vision problems hearing changes, CV/ PULM; No chest pain shortness of breath cough, syncope,edema  change in exercise tolerance. GI /GU: No adominal pain, vomiting, change in bowel habits. No blood in the stool. No significant GU symptoms. SKIN/HEME: ,no acute skin rashes suspicious lesions or bleeding. No lymphadenopathy, nodules, masses.  NEURO/ PSYCH:  No neurologic signs such as weakness numbness. No depression anxiety. IMM/ Allergy: No unusual infections.  Allergy .   REST of 12 system review negative except as per HPI   Past Medical History  Diagnosis Date  . Allergy   . GERD (gastroesophageal reflux disease)   . Varicella     as a child  . Vertigo, intermittent     positional neg ct scan  . History of abnormal  Pap smear     atypical ASCUS neg HPV neg   . Hyperthyroidism     Past Surgical History  Procedure Laterality Date  . Colposcopy  2001  . Liposuction multiple body parts  fall 2011    Family History  Problem Relation Age of Onset  . Hypertension Mother   . Stroke Sister   . Hypertension Sister   . Other Sister     moyamoya  . Crohn's disease Daughter   . Leukemia Sister   . Graves' disease Daughter      brother and husband     Social History   Social History  . Marital Status: Married    Spouse Name: N/A  . Number of Children: N/A  . Years of Education: N/A   Social History Main Topics  . Smoking status: Never Smoker   . Smokeless tobacco: None  . Alcohol Use: No  . Drug Use: No  . Sexual Activity: Not Asked   Other Topics Concern  . 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    Now roatating day night shifts    Changing to [place] rotating shifts   fuull time end of April    Back to  [employer] med psych and doing better 2015   Married   HH of 2  Husband works IT sales professional back from [place] 2009   Non  Smoker   Husband had vascectomy    Outpatient Prescriptions Prior to Visit  Medication Sig Dispense Refill  . levothyroxine (SYNTHROID, LEVOTHROID) 100 MCG tablet TAKE 1 TABLET DAILY (NEED TO SCHEDULE APPOINTMENT WITH DOCTOR ELLISON FOR FURTHER REFILLS) 90 tablet 0  . NEXIUM 20 MG capsule TAKE 1 CAPSULE DAILY BEFORE BREAKFAST 90 capsule 0  . triamterene-hydrochlorothiazide (MAXZIDE-25) 37.5-25 MG per tablet Take 0.5 tablets by mouth daily. May increase to 1 po qd 90 tablet 3   No facility-administered medications prior to visit.     EXAM:  BP 116/80 mmHg  Temp(Src) 98.3 F (36.8 C) (Oral)  Ht 5' 2.5" (1.588 m)  Wt 213 lb 12.8 oz (96.979 kg)  BMI 38.46 kg/m2  Body mass index is 38.46 kg/(m^2).  Physical Exam: Vital signs reviewed ZOX:WRUE is a well-developed  well-nourished alert cooperative    who appearsr stated age in no acute distress.  HEENT: normocephalic atraumatic , Eyes: PERRL EOM's full, conjunctiva clear, Nares: paten,t no deformity discharge or tenderness., Ears: no deformity EAC's clear TMs with normal landmarks. Mouth: clear OP, no lesions, edema.  Moist mucous membranes. Dentition in adequate repair. NECK: supple without masses, thyromegaly or bruits. CHEST/PULM:  Clear to auscultation and percussion breath sounds equal no wheeze , rales or rhonchi. No chest wall deformities or tenderness. CV: PMI is nondisplaced, S1 S2 no gallops, murmurs, rubs. Peripheral pulses are full without delay.No JVD . Breast: normal by inspection . No dimpling, discharge, masses, tenderness or discharge . ABDOMEN: Bowel sounds normal nontender  No guard or rebound, no hepato splenomegal no CVA tenderness.  No hernia. Extremtities:  No clubbing cyanosis or edema, no acute joint swelling or redness no focal atrophy NEURO:  Oriented x3, cranial nerves 3-12 appear to be intact, no obvious focal weakness,gait within normal limits no abnormal reflexes or asymmetrical SKIN: No acute rashes normal turgor, color, no bruising or petechiae  Bug bites  [place] no infectious looking. PSYCH: Oriented, good eye contact, no obvious depression anxiety, cognition and judgment appear normal. LN: no cervical axillary inguinal adenopathy Pelvic: NL ext GU, labia clear without l rash . Vagina  Clear nl dc .Cervix: clear  UTERUS: Neg CMT Adnexa:  clear no masses limited buy large abd wall . PAP done   Hi risk hpv  Rectal no masses heme neg   Lab Results  Component Value Date   WBC 6.7 10/03/2014   HGB 13.3 10/03/2014   HCT 41.6 10/03/2014   PLT 289.0 10/03/2014   GLUCOSE 107* 10/03/2014   CHOL 209* 10/03/2014   TRIG 72.0 10/03/2014   HDL 40.90 10/03/2014   LDLDIRECT 156.6 05/09/2012   LDLCALC 154* 10/03/2014   ALT 16 10/03/2014   AST 21 10/03/2014   NA 139 10/03/2014   K  3.8 10/03/2014   CL 102 10/03/2014   CREATININE 0.99 10/03/2014   BUN 18 10/03/2014   CO2 31 10/03/2014   TSH 2.40 10/03/2014   BP Readings from Last 3 Encounters:  10/10/14 116/80  02/27/14 116/86  11/01/13 118/80   Wt Readings from Last 3 Encounters:  10/10/14 213 lb 12.8 oz (96.979 kg)  02/27/14 202 lb 8 oz (91.853 kg)  11/01/13 203 lb 11.2 oz (92.398 kg)    ASSESSMENT AND PLAN:  Discussed the following assessment and  plan:  Visit for preventive health examination  Encounter for routine gynecological examination  Hyperlipidemia  Essential hypertension  Hypothyroidism, postablative  Fasting hyperglycemia  Colon cancer screening - Plan: Ambulatory referral to Gastroenterology  Gastroesophageal reflux disease, esophagitis presence not specified lsi discussed weight loss cut out sugars and fu  No meds at tjis time  Continue  bp control     Consider check a1c in future if continueing.  But lsi advised at this time Patient Care Team: Madelin Headings, MD as PCP - General Romero Belling, MD as Attending Physician (Internal Medicine) Genia Del, MD as Consulting Physician (Obstetrics and Gynecology) Patient Instructions  Try weaning the nexium    Alternating with 150 mg ov ranitidine otc  Twice a day . To wean off  This is the same as brand zantac . Get mammo gram  [facility]  [facility]. Will refer for colonoscopy colon cancer screening .YOu should be contacted .  Will notify you when pap results are available.    Intensify lifestyle interventions. As  Possible  Healthy lifestyle includes : At least 150 minutes of exercise weeks  , weight at healthy levels, which is usually   BMI 19-25. Avoid trans fats and processed foods;  Increase fresh fruits and veges to 5 servings per day. And avoid sweet beverages including tea and juice. Mediterranean diet with olive oil and nuts have been noted to be heart and brain healthy . Avoid tobacco products  . Limit  alcohol to  7 per week for women and 14 servings for men.  Get adequate sleep . Wear seat belts . Don't text and drive .        Neta Mends. Ricketta Colantonio M.D.

## 2014-10-10 NOTE — Patient Instructions (Addendum)
Try weaning the nexium    Alternating with 150 mg ov ranitidine otc  Twice a day . To wean off  This is the same as brand zantac . Get mammo gram  Call  Bre[facility] center or [facility]. Will refer for colonoscopy colon cancer screening .YOu should be contacted .  Will notify you when pap results are available.    Intensify lifestyle interventions. As  Possible  Healthy lifestyle includes : At least 150 minutes of exercise weeks  , weight at healthy levels, which is usually   BMI 19-25. Avoid trans fats and processed foods;  Increase fresh fruits and veges to 5 servings per day. And avoid sweet beverages including tea and juice. Mediterranean diet with olive oil and nuts have been noted to be heart and brain healthy . Avoid tobacco products . Limit  alcohol to  7 per week for women and 14 servings for men.  Get adequate sleep . Wear seat belts . Don't text and drive .

## 2014-10-12 LAB — CYTOLOGY - PAP

## 2014-10-13 NOTE — Progress Notes (Signed)
Quick Note:  Tell patient PAP is normal. HPV high risk is negative ______ 

## 2014-10-15 ENCOUNTER — Encounter: Payer: Self-pay | Admitting: Family Medicine

## 2014-11-24 ENCOUNTER — Other Ambulatory Visit: Payer: Self-pay | Admitting: Internal Medicine

## 2014-11-27 NOTE — Telephone Encounter (Signed)
Sent to the pharmacy by e-scribe. 

## 2015-08-24 ENCOUNTER — Other Ambulatory Visit: Payer: Self-pay | Admitting: Internal Medicine

## 2015-08-26 ENCOUNTER — Other Ambulatory Visit: Payer: Self-pay | Admitting: Family Medicine

## 2015-08-26 ENCOUNTER — Telehealth: Payer: Self-pay | Admitting: Family Medicine

## 2015-08-26 DIAGNOSIS — Z0000 Encounter for general adult medical examination without abnormal findings: Secondary | ICD-10-CM

## 2015-08-26 NOTE — Telephone Encounter (Signed)
Sent to the pharmacy for 90 days.  Message sent to scheduling.  Pt due for cpx and lab work in Sept 2017.

## 2015-08-26 NOTE — Telephone Encounter (Signed)
Pt is due for cpx and lab work in Sept. 2017.  I have placed the lab orders.  Please help the pt to make both appointments.  Thanks!!!

## 2015-08-27 NOTE — Telephone Encounter (Signed)
Pt will call back to sch.

## 2015-09-09 ENCOUNTER — Telehealth: Payer: Self-pay | Admitting: Family Medicine

## 2015-09-09 NOTE — Telephone Encounter (Signed)
Received a fax from [facility].  Nexium will no longer be covered without PA.  Must try and fail Aciphex, omeprazole, patoprazole and Prilosec Powder Packet.  Will need to inform the pt.

## 2015-09-10 NOTE — Telephone Encounter (Signed)
Send in protonix 40 per day  90 with refills x1

## 2015-09-10 NOTE — Telephone Encounter (Signed)
Spoke to the pt.  She has tried Aciphex in the past and Zantac but no others.  [facility] what medication she would like to prescribe.

## 2015-09-11 NOTE — Telephone Encounter (Signed)
Left a message for pt to return my call. 

## 2015-09-13 MED ORDER — PANTOPRAZOLE SODIUM 40 MG PO TBEC: 40.0000 mg | DELAYED_RELEASE_TABLET | Freq: Every day | ORAL | 1 refills | Status: DC

## 2015-09-13 NOTE — Telephone Encounter (Signed)
Sent to the pharmacy by e[facility]scribe.  Pt notified. 

## 2015-09-18 ENCOUNTER — Encounter: Payer: Self-pay | Admitting: Family Medicine

## 2015-09-18 ENCOUNTER — Other Ambulatory Visit: Payer: Self-pay | Admitting: Internal Medicine

## 2015-09-18 NOTE — Telephone Encounter (Signed)
Sent to the pharmacy by e-scribe for 90 days.  Letter mailed to the pt to make cpx appt.  Did try to reach her by telephone in July to schedule appt.

## 2015-10-06 ENCOUNTER — Other Ambulatory Visit: Payer: Self-pay | Admitting: Internal Medicine

## 2015-12-18 ENCOUNTER — Encounter: Admitting: Internal Medicine

## 2015-12-19 NOTE — Progress Notes (Signed)
Pre visit review using our clinic review tool, if applicable. No additional management support is needed unless otherwise documented below in the visit note.  Chief Complaint  Patient presents with  . Annual Exam    HPI: Patient  Deborah Mcdowell  55 y.o. comes in today for Preventive Health Care visit  And Chronic disease management and med management  Takes thyroid medicine with her other pills in the morning that includes proton X. She needs refills. At somt point ahad alt med cod says t3 t4 was off and gave her oral drops  To take    Health Maintenance  Topic Date Due  . MAMMOGRAM  04/19/2012  . COLONOSCOPY  04/19/2012  . INFLUENZA VACCINE  07/02/2016 (Originally 09/03/2015)  . Hepatitis C Screening  12/18/2016 (Originally 1960/12/27)  . HIV Screening  12/18/2016 (Originally 04/19/1977)  . TETANUS/TDAP  02/02/2017  . PAP SMEAR  10/09/2017   Health Maintenance Review LIFESTYLE:  Exercise:  zumba  Tobacco/ETS:no Alcohol:  no Sugar beverages: once a day .  Sleep: about 7-8  Drug use: no HH of  3 .Marland KitchenMarland KitchenMarland KitchenMarland Kitchen2 cats  Work: one night per week .   [employer]     ROS:  GEN/ HEENT: No fever, significant weight changes sweats headaches vision problems hearing changes, CV/ PULM; No chest pain shortness of breath cough, syncope,edema  change in exercise tolerance. GI /GU: No adominal pain, vomiting, change in bowel habits. No blood in the stool. No significant GU symptoms. SKIN/HEME: ,no acute skin rashes suspicious lesions or bleeding. No lymphadenopathy, nodules, masses.  NEURO/ PSYCH:  No neurologic signs such as weakness numbness. No depression anxiety. IMM/ Allergy: No unusual infections.  Allergy .   REST of 12 system review negative except as per HPI   Past Medical History:  Diagnosis Date  . Allergy   . GERD (gastroesophageal reflux disease)   . History of abnormal Pap smear    atypical ASCUS neg HPV neg   . Hyperthyroidism   . Varicella    as a child  . Vertigo, intermittent    positional neg ct scan    Past Surgical History:  Procedure Laterality Date  . COLPOSCOPY  07-06-99  . LIPOSUCTION MULTIPLE BODY PARTS  fall 07/05/09    Family History  Problem Relation Age of Onset  . Hypertension Mother   . Stroke Sister     passed away low bg dm 07/06/2015  . Hypertension Sister   . Other Sister     moyamoya  . Leukemia Sister   . Crohn's disease Daughter   . Graves' disease Daughter      brother and husband     Social History   Social History  . Marital status: Married    Spouse name: N/A  . Number of children: N/A  . Years of education: N/A   Social History Main Topics  . Smoking status: Never Smoker  . Smokeless tobacco: Never Used  . Alcohol use No  . Drug use: No  . Sexual activity: Not Asked   Other Topics Concern  . 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    Now roatating day night shifts    Changing to [place] rotating shifts   fuull time end of April    Back to [facility] med psych and doing better 07-05-13   Married   HH of 2  Husband works Chief Financial Officer  Moved back from [place] 2009   Non  Smoker   Husband had vascectomy    Outpatient Medications Prior to Visit  Medication Sig Dispense Refill  . levothyroxine (SYNTHROID, LEVOTHROID) 100 MCG tablet TAKE 1 TABLET DAILY (NEED TO SCHEDULE APPOINTMENT WITH DOCTOR ELLISON FOR FURTHER REFILLS) 90 tablet 0  . pantoprazole (PROTONIX) 40 MG tablet Take 1 tablet (40 mg total) by mouth daily. 90 tablet 1  . triamterene-hydrochlorothiazide (MAXZIDE-25) 37.5-25 MG tablet TAKE ONE-HALF (1/2) TABLET DAILY, MAY INCREASE TO ONE TABLET DAILY 90 tablet 0  . NEXIUM 20 MG capsule TAKE 1 CAPSULE DAILY BEFORE BREAKFAST 90 capsule 0   No facility-administered medications prior to visit.      EXAM:  BP 116/78 (BP Location: Left Arm, Patient Position: Sitting, Cuff Size: Large)   Temp 97.5 F (36.4 C) (Oral)   Ht 5' 2.5" (1.588 m)   Wt 208 lb  12.8 oz (94.7 kg)   BMI 37.58 kg/m   Body mass index is 37.58 kg/m.  Physical Exam: Vital signs reviewed ONG:EXBMGEN:This is a well-developed well-nourished alert cooperative    who appearsr stated age in no acute distress.  HEENT: normocephalic atraumatic , Eyes: PERRL EOM's full, conjunctiva clear, Nares: paten,t no deformity discharge or tenderness., Ears: no deformity EAC's clear TMs with normal landmarks. Mouth: clear OP, no lesions, edema.  Moist mucous membranes. Dentition in adequate repair. NECK: supple without masses, thyromegaly or bruits.Breast: normal by inspection . No dimpling, discharge, masses, tenderness or discharge . CHEST/PULM:  Clear to auscultation and percussion breath sounds equal no wheeze , rales or rhonchi. No chest wall deformities or tenderness. CV: PMI is nondisplaced, S1 S2 no gallops, murmurs, rubs. Peripheral pulses are full without delay.No JVD .  ABDOMEN: Bowel sounds normal nontender  No guard or rebound, no hepato splenomegal no CVA tenderness.  No hernia. Extremtities:  No clubbing cyanosis or edema, no acute joint swelling or redness no focal atrophy NEURO:  Oriented x3, cranial nerves 3-12 appear to be intact, no obvious focal weakness,gait within normal limits no abnormal reflexes or asymmetrical SKIN: No acute rashes normal turgor, color, no bruising or petechiae. ? Dermat fibroma left shoulder dark spot PSYCH: Oriented, good eye contact, no obvious depression anxiety, cognition and judgment appear normal. LN: no cervical axillary inguinal adenopathy   Wt Readings from Last 3 Encounters:  12/20/15 208 lb 12.8 oz (94.7 kg)  10/10/14 213 lb 12.8 oz (97 kg)  02/27/14 202 lb 8 oz (91.9 kg)   BP Readings from Last 3 Encounters:  12/20/15 116/78  10/10/14 116/80  02/27/14 116/86    ASSESSMENT AND PLAN:  Discussed the following assessment and plan:  Visit for preventive health examination - Plan: Basic metabolic panel, CBC with Differential/Platelet,  Hemoglobin A1c, Hepatic function panel, Lipid panel, TSH, T4, free  Essential hypertension - Plan: Basic metabolic panel, CBC with Differential/Platelet, Hemoglobin A1c, Hepatic function panel, Lipid panel, TSH, T4, free  Hypothyroidism, postablative - Plan: Basic metabolic panel, CBC with Differential/Platelet, Hemoglobin A1c, Hepatic function panel, Lipid panel, TSH, T4, free  Fasting hyperglycemia - Plan: Basic metabolic panel, CBC with Differential/Platelet, Hemoglobin A1c, Hepatic function panel, Lipid panel, TSH, T4, free  Medication management - Plan: Basic metabolic panel, CBC with Differential/Platelet, Hemoglobin A1c, Hepatic function panel, Lipid panel, TSH, T4, free  Hypothyroidism, unspecified type - revewied med  lab today   Gastroesophageal reflux disease, esophagitis presence not specified - ppi use  mammo coloncancer screen disc    Reviewed med  And fu  plna optinos  Patient Care Team: Madelin HeadingsWanda K Yassmin Binegar, MD as PCP - General Romero BellingSean Ellison, MD as Attending Physician (Internal Medicine) Genia DelMarie-Lyne Lavoie, MD as Consulting Physician (Obstetrics and Gynecology) Patient Instructions  Attending healthy lifestyle limit or 0 out sugar beverages decrease snacks get enough sleep. Avoiding processed foods and activity will help metabolic factors and reduce weight. Take your thyroid medicine and hour before anything else except for water or 2 hours after anything in her mouth. This will help get better absorption. Get your mammogram. Colon cancer screening check your insurance about coverage for cologuard. If you want to move forward contact us and we'll send in the order form.   Neta MendsWanda K. Jalayla Chrismer M.D.

## 2015-12-20 ENCOUNTER — Other Ambulatory Visit: Payer: Self-pay | Admitting: Internal Medicine

## 2015-12-20 ENCOUNTER — Encounter: Payer: Self-pay | Admitting: Internal Medicine

## 2015-12-20 ENCOUNTER — Other Ambulatory Visit

## 2015-12-20 ENCOUNTER — Ambulatory Visit (INDEPENDENT_AMBULATORY_CARE_PROVIDER_SITE_OTHER): Admitting: Internal Medicine

## 2015-12-20 VITALS — BP 116/78 | Temp 97.5°F | Ht 62.5 in | Wt 208.8 lb

## 2015-12-20 DIAGNOSIS — R7301 Impaired fasting glucose: Secondary | ICD-10-CM

## 2015-12-20 DIAGNOSIS — Z0000 Encounter for general adult medical examination without abnormal findings: Secondary | ICD-10-CM | POA: Diagnosis not present

## 2015-12-20 DIAGNOSIS — E039 Hypothyroidism, unspecified: Secondary | ICD-10-CM

## 2015-12-20 DIAGNOSIS — E890 Postprocedural hypothyroidism: Secondary | ICD-10-CM | POA: Diagnosis not present

## 2015-12-20 DIAGNOSIS — K219 Gastro-esophageal reflux disease without esophagitis: Secondary | ICD-10-CM

## 2015-12-20 DIAGNOSIS — I10 Essential (primary) hypertension: Secondary | ICD-10-CM

## 2015-12-20 DIAGNOSIS — R7309 Other abnormal glucose: Secondary | ICD-10-CM

## 2015-12-20 DIAGNOSIS — Z79899 Other long term (current) drug therapy: Secondary | ICD-10-CM | POA: Diagnosis not present

## 2015-12-20 DIAGNOSIS — R7989 Other specified abnormal findings of blood chemistry: Secondary | ICD-10-CM

## 2015-12-20 LAB — CBC WITH DIFFERENTIAL/PLATELET
BASOS PCT: 0.8 % (ref 0.0–3.0)
Basophils Absolute: 0.1 10*3/uL (ref 0.0–0.1)
EOS PCT: 4.9 % (ref 0.0–5.0)
Eosinophils Absolute: 0.4 10*3/uL (ref 0.0–0.7)
HCT: 42.1 % (ref 36.0–46.0)
HEMOGLOBIN: 13.4 g/dL (ref 12.0–15.0)
LYMPHS ABS: 2.1 10*3/uL (ref 0.7–4.0)
Lymphocytes Relative: 29.1 % (ref 12.0–46.0)
MCHC: 31.9 g/dL (ref 30.0–36.0)
MCV: 79.9 fl (ref 78.0–100.0)
MONO ABS: 0.6 10*3/uL (ref 0.1–1.0)
MONOS PCT: 8 % (ref 3.0–12.0)
Neutro Abs: 4.1 10*3/uL (ref 1.4–7.7)
Neutrophils Relative %: 57.2 % (ref 43.0–77.0)
Platelets: 300 10*3/uL (ref 150.0–400.0)
RBC: 5.26 Mil/uL — AB (ref 3.87–5.11)
RDW: 16.3 % — AB (ref 11.5–15.5)
WBC: 7.2 10*3/uL (ref 4.0–10.5)

## 2015-12-20 LAB — BASIC METABOLIC PANEL
BUN: 16 mg/dL (ref 6–23)
CHLORIDE: 102 meq/L (ref 96–112)
CO2: 29 mEq/L (ref 19–32)
Calcium: 9.6 mg/dL (ref 8.4–10.5)
Creatinine, Ser: 1 mg/dL (ref 0.40–1.20)
GFR: 74.4 mL/min (ref >60.00)
GLUCOSE: 100 mg/dL — AB (ref 70–99)
POTASSIUM: 4.2 meq/L (ref 3.5–5.1)
SODIUM: 140 meq/L (ref 135–145)

## 2015-12-20 LAB — HEPATIC FUNCTION PANEL
ALBUMIN: 4.4 g/dL (ref 3.5–5.2)
ALT: 15 U/L (ref 0–35)
AST: 19 U/L (ref 0–37)
Alkaline Phosphatase: 64 U/L (ref 39–117)
BILIRUBIN TOTAL: 0.4 mg/dL (ref 0.2–1.2)
Bilirubin, Direct: 0.1 mg/dL (ref 0.0–0.3)
Total Protein: 7.6 g/dL (ref 6.0–8.3)

## 2015-12-20 LAB — LIPID PANEL
CHOLESTEROL: 235 mg/dL — AB (ref 0–200)
HDL: 44.2 mg/dL (ref >39.00)
LDL Cholesterol: 178 mg/dL — ABNORMAL HIGH (ref 0–99)
NonHDL: 191.15
TRIGLYCERIDES: 65 mg/dL (ref 0.0–149.0)
Total CHOL/HDL Ratio: 5
VLDL: 13 mg/dL (ref 0.0–40.0)

## 2015-12-20 LAB — T4, FREE: FREE T4: 1.1 ng/dL (ref 0.60–1.60)

## 2015-12-20 LAB — TSH: TSH: 0.8 u[IU]/mL (ref 0.35–4.50)

## 2015-12-20 MED ORDER — PANTOPRAZOLE SODIUM 40 MG PO TBEC: 40.0000 mg | DELAYED_RELEASE_TABLET | Freq: Every day | ORAL | 3 refills | Status: AC

## 2015-12-20 MED ORDER — TRIAMTERENE-HCTZ 37.5-25 MG PO TABS: ORAL_TABLET | ORAL | 3 refills | Status: AC

## 2015-12-20 MED ORDER — LEVOTHYROXINE SODIUM 100 MCG PO TABS: ORAL_TABLET | ORAL | 2 refills | Status: AC

## 2015-12-20 NOTE — Patient Instructions (Signed)
Attending healthy lifestyle limit or 0 out sugar beverages decrease snacks get enough sleep. Avoiding processed foods and activity will help metabolic factors and reduce weight. Take your thyroid medicine and hour before anything else except for water or 2 hours after anything in her mouth. This will help get better absorption. Get your mammogram. Colon cancer screening check your insurance about coverage for cologuard. If you want to move forward contact us and we'll send in the order form.

## 2015-12-21 LAB — CBC
HEMATOCRIT: 43.5 % (ref 35.0–45.0)
HEMOGLOBIN: 13.5 g/dL (ref 11.7–15.5)
MCH: 25.9 pg — ABNORMAL LOW (ref 27.0–33.0)
MCHC: 31 g/dL — AB (ref 32.0–36.0)
MCV: 83.3 fL (ref 80.0–100.0)
MPV: 9.9 fL (ref 7.5–12.5)
Platelets: 310 10*3/uL (ref 140–400)
RBC: 5.22 MIL/uL — AB (ref 3.80–5.10)
RDW: 15.8 % — ABNORMAL HIGH (ref 11.0–15.0)
WBC: 6.8 10*3/uL (ref 3.8–10.8)

## 2015-12-24 LAB — HEMOGLOBIN A1C
Hgb A1c MFr Bld: 4.9 % (ref <5.7)
Mean Plasma Glucose: 94 mg/dL

## 2015-12-25 ENCOUNTER — Other Ambulatory Visit: Payer: Self-pay | Admitting: Family Medicine

## 2015-12-25 DIAGNOSIS — E7849 Other hyperlipidemia: Secondary | ICD-10-CM

## 2016-01-08 ENCOUNTER — Encounter: Payer: Self-pay | Admitting: Internal Medicine

## 2016-10-23 ENCOUNTER — Encounter: Payer: Self-pay | Admitting: Internal Medicine

## 2021-08-28 ENCOUNTER — Telehealth: Payer: Self-pay | Admitting: *Deleted

## 2021-08-28 NOTE — Telephone Encounter (Signed)
Transition Care Management Unsuccessful Follow-up Telephone Call  Date of discharge and from where:  [facility]  Attempts:  1st Attempt  Reason for unsuccessful TCM follow-up call:  No answer/busy
